# Patient Record
Sex: Female | Born: 1985 | Race: White | Hispanic: No | Marital: Married | Smoking: Never smoker
Health system: Southern US, Community
[De-identification: ages and names within clinical notes are randomized; demographics above are authoritative.]

## PROBLEM LIST (undated history)

## (undated) DIAGNOSIS — F988 Other specified behavioral and emotional disorders with onset usually occurring in childhood and adolescence: Secondary | ICD-10-CM

## (undated) HISTORY — PX: APPENDECTOMY: SHX54

## (undated) HISTORY — DX: Other specified behavioral and emotional disorders with onset usually occurring in childhood and adolescence: F98.8

---
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
## 2007-01-19 ENCOUNTER — Ambulatory Visit: Payer: Self-pay | Admitting: Internal Medicine

## 2007-03-10 ENCOUNTER — Ambulatory Visit: Payer: Self-pay | Admitting: Internal Medicine

## 2007-03-19 ENCOUNTER — Telehealth (INDEPENDENT_AMBULATORY_CARE_PROVIDER_SITE_OTHER): Payer: Self-pay | Admitting: *Deleted

## 2007-03-20 LAB — CONVERTED CEMR LAB
BUN: 5 mg/dL — ABNORMAL LOW (ref 6–23)
Basophils Absolute: 0 10*3/uL (ref 0.0–0.1)
Basophils Relative: 0.8 % (ref 0.0–1.0)
CO2: 28 meq/L (ref 19–32)
Calcium: 9.3 mg/dL (ref 8.4–10.5)
Chloride: 104 meq/L (ref 96–112)
Cholesterol: 164 mg/dL (ref 0–200)
Creatinine, Ser: 0.7 mg/dL (ref 0.4–1.2)
Eosinophils Absolute: 0.1 10*3/uL (ref 0.0–0.6)
Eosinophils Relative: 3.5 % (ref 0.0–5.0)
GFR calc Af Amer: 140 mL/min
GFR calc non Af Amer: 116 mL/min
Glucose, Bld: 84 mg/dL (ref 70–99)
HCT: 35.5 % — ABNORMAL LOW (ref 36.0–46.0)
HDL: 38.9 mg/dL — ABNORMAL LOW (ref >39.0)
Hemoglobin: 12.5 g/dL (ref 12.0–15.0)
LDL Cholesterol: 103 mg/dL — ABNORMAL HIGH (ref 0–99)
Lymphocytes Relative: 35.8 % (ref 12.0–46.0)
MCHC: 35.3 g/dL (ref 30.0–36.0)
MCV: 86.1 fL (ref 78.0–100.0)
Monocytes Absolute: 0.4 10*3/uL (ref 0.2–0.7)
Monocytes Relative: 9.7 % (ref 3.0–11.0)
Neutro Abs: 2.1 10*3/uL (ref 1.4–7.7)
Neutrophils Relative %: 50.2 % (ref 43.0–77.0)
Platelets: 252 10*3/uL (ref 150–400)
Potassium: 3.7 meq/L (ref 3.5–5.1)
RBC: 4.12 M/uL (ref 3.87–5.11)
RDW: 11.5 % (ref 11.5–14.6)
Sodium: 138 meq/L (ref 135–145)
TSH: 1.66 microintl units/mL (ref 0.35–5.50)
Total CHOL/HDL Ratio: 4.2
Triglycerides: 110 mg/dL (ref 0–149)
VLDL: 22 mg/dL (ref 0–40)
WBC: 4.1 10*3/uL — ABNORMAL LOW (ref 4.5–10.5)

## 2007-08-05 ENCOUNTER — Ambulatory Visit: Payer: Self-pay | Admitting: Internal Medicine

## 2007-08-05 DIAGNOSIS — F909 Attention-deficit hyperactivity disorder, unspecified type: Secondary | ICD-10-CM

## 2007-09-28 ENCOUNTER — Ambulatory Visit: Payer: Self-pay | Admitting: Internal Medicine

## 2007-09-29 ENCOUNTER — Telehealth: Payer: Self-pay | Admitting: Internal Medicine

## 2007-10-28 ENCOUNTER — Telehealth (INDEPENDENT_AMBULATORY_CARE_PROVIDER_SITE_OTHER): Payer: Self-pay | Admitting: *Deleted

## 2007-11-26 ENCOUNTER — Telehealth (INDEPENDENT_AMBULATORY_CARE_PROVIDER_SITE_OTHER): Payer: Self-pay | Admitting: *Deleted

## 2007-12-23 ENCOUNTER — Telehealth (INDEPENDENT_AMBULATORY_CARE_PROVIDER_SITE_OTHER): Payer: Self-pay | Admitting: *Deleted

## 2008-01-18 ENCOUNTER — Telehealth (INDEPENDENT_AMBULATORY_CARE_PROVIDER_SITE_OTHER): Payer: Self-pay | Admitting: *Deleted

## 2008-05-17 ENCOUNTER — Ambulatory Visit: Payer: Self-pay | Admitting: Internal Medicine

## 2008-05-17 DIAGNOSIS — J309 Allergic rhinitis, unspecified: Secondary | ICD-10-CM | POA: Insufficient documentation

## 2008-06-14 ENCOUNTER — Telehealth (INDEPENDENT_AMBULATORY_CARE_PROVIDER_SITE_OTHER): Payer: Self-pay | Admitting: *Deleted

## 2008-07-13 ENCOUNTER — Telehealth (INDEPENDENT_AMBULATORY_CARE_PROVIDER_SITE_OTHER): Payer: Self-pay | Admitting: *Deleted

## 2008-08-26 ENCOUNTER — Telehealth (INDEPENDENT_AMBULATORY_CARE_PROVIDER_SITE_OTHER): Payer: Self-pay | Admitting: *Deleted

## 2008-10-14 ENCOUNTER — Telehealth (INDEPENDENT_AMBULATORY_CARE_PROVIDER_SITE_OTHER): Payer: Self-pay | Admitting: *Deleted

## 2008-12-07 ENCOUNTER — Telehealth: Payer: Self-pay | Admitting: Internal Medicine

## 2008-12-13 ENCOUNTER — Telehealth (INDEPENDENT_AMBULATORY_CARE_PROVIDER_SITE_OTHER): Payer: Self-pay | Admitting: *Deleted

## 2009-05-15 ENCOUNTER — Telehealth (INDEPENDENT_AMBULATORY_CARE_PROVIDER_SITE_OTHER): Payer: Self-pay | Admitting: *Deleted

## 2009-05-16 ENCOUNTER — Ambulatory Visit: Payer: Self-pay | Admitting: Family Medicine

## 2009-11-13 ENCOUNTER — Telehealth (INDEPENDENT_AMBULATORY_CARE_PROVIDER_SITE_OTHER): Payer: Self-pay | Admitting: *Deleted

## 2009-11-13 ENCOUNTER — Ambulatory Visit: Payer: Self-pay | Admitting: Family

## 2009-11-13 LAB — CONVERTED CEMR LAB
BUN: 11 mg/dL (ref 6–23)
Basophils Absolute: 0 10*3/uL (ref 0.0–0.1)
Basophils Relative: 0.1 % (ref 0.0–3.0)
CO2: 29 meq/L (ref 19–32)
Calcium: 9 mg/dL (ref 8.4–10.5)
Chloride: 107 meq/L (ref 96–112)
Creatinine, Ser: 0.7 mg/dL (ref 0.4–1.2)
Eosinophils Absolute: 0.2 10*3/uL (ref 0.0–0.7)
Eosinophils Relative: 3.2 % (ref 0.0–5.0)
GFR calc non Af Amer: 111.72 mL/min (ref >60)
Glucose, Bld: 85 mg/dL (ref 70–99)
HCT: 37.6 % (ref 36.0–46.0)
Hemoglobin: 12.8 g/dL (ref 12.0–15.0)
Lymphocytes Relative: 33.4 % (ref 12.0–46.0)
Lymphs Abs: 2 10*3/uL (ref 0.7–4.0)
MCHC: 34.1 g/dL (ref 30.0–36.0)
MCV: 88.4 fL (ref 78.0–100.0)
Monocytes Absolute: 0.6 10*3/uL (ref 0.1–1.0)
Monocytes Relative: 9.9 % (ref 3.0–12.0)
Neutro Abs: 3.2 10*3/uL (ref 1.4–7.7)
Neutrophils Relative %: 53.4 % (ref 43.0–77.0)
Platelets: 304 10*3/uL (ref 150.0–400.0)
Potassium: 3.6 meq/L (ref 3.5–5.1)
RBC: 4.25 M/uL (ref 3.87–5.11)
RDW: 11.8 % (ref 11.5–14.6)
Sodium: 140 meq/L (ref 135–145)
WBC: 6 10*3/uL (ref 4.5–10.5)

## 2009-11-14 ENCOUNTER — Encounter: Payer: Self-pay | Admitting: Family

## 2010-02-23 ENCOUNTER — Telehealth (INDEPENDENT_AMBULATORY_CARE_PROVIDER_SITE_OTHER): Payer: Self-pay | Admitting: *Deleted

## 2010-05-07 ENCOUNTER — Ambulatory Visit: Payer: Self-pay | Admitting: Internal Medicine

## 2010-05-10 ENCOUNTER — Telehealth: Payer: Self-pay | Admitting: Internal Medicine

## 2010-05-10 ENCOUNTER — Ambulatory Visit: Payer: Self-pay | Admitting: Internal Medicine

## 2010-07-18 ENCOUNTER — Telehealth (INDEPENDENT_AMBULATORY_CARE_PROVIDER_SITE_OTHER): Payer: Self-pay | Admitting: *Deleted

## 2010-10-09 ENCOUNTER — Ambulatory Visit: Admit: 2010-10-09 | Payer: Self-pay | Admitting: Internal Medicine

## 2010-10-10 ENCOUNTER — Telehealth: Payer: Self-pay | Admitting: Internal Medicine

## 2010-10-15 ENCOUNTER — Ambulatory Visit
Admission: RE | Admit: 2010-10-15 | Discharge: 2010-10-15 | Payer: Self-pay | Source: Home / Self Care | Attending: Internal Medicine | Admitting: Internal Medicine

## 2010-10-30 NOTE — Progress Notes (Signed)
Summary: Refill Request/appt   Phone Note Refill Request Call back at Work Phone [phone] Message from:  Pharmacy on July 18, 2010 3:14 PM  Refills Requested: Medication #1:  ADDERALL 10 MG  TABS 2 by mouth in AM and 1 by mouth at PM PRN   Dosage confirmed as above?Dosage Confirmed   Supply Requested: 1 month Please call patient's mom when ready to pick up @ [phone]   Method Requested: Pick up at Office Next Appointment Scheduled: none Initial call taken by: Harold Barban,  July 18, 2010 3:14 PM  Follow-up for Phone Call        has been seen for acutes but not for follow up on meds, okay to fill?  Follow-up by: Army Fossa CMA,  July 18, 2010 3:30 PM  Additional Follow-up for Phone Call Additional follow up Details #1::        ok #90, no RF schedule a visit within the next 3 months please Additional Follow-up by: [facility] E. Paz MD,  July 18, 2010 6:10 PM    Additional Follow-up for Phone Call Additional follow up Details #2::    left message rx was ready. Pt needs appt in 3 months.  Follow-up by: Army Fossa CMA,  July 19, 2010 8:11 AM  Additional Follow-up for Phone Call Additional follow up Details #3:: Details for Additional Follow-up Action Taken: [email or web address].Harold Barban  July 19, 2010 8:43 AM  Patient has an appt 1.10.12.Harold Barban  July 20, 2010 8:46 AM  Prescriptions: ADDERALL 10 MG  TABS (AMPHETAMINE-DEXTROAMPHETAMINE) 2 by mouth in AM and 1 by mouth at PM PRN  #90 x 0   Entered by:   Army Fossa CMA   Authorized by:   Nolon Rod. Paz MD   Signed by:   Army Fossa CMA on 07/19/2010   Method used:   Print then Give to Patient   RxID:   [id number]

## 2010-10-30 NOTE — Assessment & Plan Note (Signed)
Summary: spider bite/cbs   Vital Signs:  Patient profile:   25 year old female Weight:      133.13 pounds Pulse rate:   92 / minute Pulse rhythm:   regular BP sitting:   126 / 82  (left arm) Cuff size:   regular  Vitals Entered By: Army Fossa CMA (May 07, 2010 1:08 PM) CC: Bug bite upper leg Comments - has been there for 2-3 days.  Very red, swollen.    History of Present Illness: started with what she thought it was a bug bite at the right buttock 2 days ago The area is now getting slightly bigger, she has applied some heat  ROS She saw some discharge 2 days ago  no blisters  No fevers   Current Medications (verified): 1)  Clarinex 5 Mg Tabs (Desloratadine) .Marland Kitchen.. 1 By Mouth Once Daily 2)  Adderall 10 Mg  Tabs (Amphetamine-Dextroamphetamine) .... 2 By Mouth in Am and 1 By Mouth At Pm 3)  Trinessa (28) 0.035 Mg Tabs (Norgestimate-Ethinyl Estradiol) .... Use As Directed 4)  Minocycline Hcl 100 Mg Caps (Minocycline Hcl) .Marland Kitchen.. 1 By Mouth Two Times A Day  Allergies (verified): No Known Drug Allergies  Past History:  Past Medical History: Reviewed history from 05/17/2008 and no changes required. ADD Allergic rhinitis  Past Surgical History: Reviewed history from 03/10/2007 and no changes required. Appendectomy  Social History: Single graduated from college at [employer]. 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   Skin:  at the distal right buttock she has a 2x1centimeter  area of induration , slight warmness and tenderness. No fluctuancy or discharge noted   Impression & Recommendations:  Problem # 1:  CELLULITIS, BUTTOCKS (ICD-682.5) small area of cellulitis She is on Minocycline  routinely Prescribe Bactrim Avoid excessive sun exposure Will call if no better in few  days Her updated medication list for this problem includes:    Minocycline Hcl 100 Mg  Caps (Minocycline hcl) .Marland Kitchen... 1 by mouth two times a day    Bactrim Ds 800-160 Mg Tabs (Sulfamethoxazole-trimethoprim) .Marland Kitchen... 1 by mouth two times a day  Complete Medication List: 1)  Adderall 10 Mg Tabs (Amphetamine-dextroamphetamine) .... 2 by mouth in am and 1 by mouth at pm prn 2)  Trinessa (28) 0.035 Mg Tabs (Norgestimate-ethinyl estradiol) .... Use as directed 3)  Minocycline Hcl 100 Mg Caps (Minocycline hcl) .Marland Kitchen.. 1 by mouth two times a day 4)  Bactrim Ds 800-160 Mg Tabs (Sulfamethoxazole-trimethoprim) .Marland Kitchen.. 1 by mouth two times a day Prescriptions: BACTRIM DS 800-160 MG TABS (SULFAMETHOXAZOLE-TRIMETHOPRIM) 1 by mouth two times a day  #14 x 0   Entered and Authorized by:   Nolon Rod. Paz MD   Signed by:   Nolon Rod. Paz MD on 05/07/2010   Method used:   Print then Give to Patient   RxID:   [id number]

## 2010-10-30 NOTE — Progress Notes (Signed)
Summary: ADDERRAL REFILL  Phone Note Call from Patient Call back at Work Phone [phone]   Caller: Patient Summary of Call: PATIENT FORGOT TO ASK FOR ADDERRAL PRESCRIPTION AT OFFICE VISIT---  PLEASE CALL MOM = BARBARA AT [phone] WHEN READY FOR PICKUP Initial call taken by: Jerolyn Shin,  November 13, 2009 11:30 AM  Follow-up for Phone Call        spoke w/ mom aware prescription ready for pick up....Marland KitchenMarland KitchenDoristine Devoid  November 13, 2009 12:01 PM     Prescriptions: ADDERALL 10 MG  TABS (AMPHETAMINE-DEXTROAMPHETAMINE) 2 by mouth in AM and 1 by mouth at PM  #90 x 0   Entered by:   Doristine Devoid   Authorized by:   Nolon Rod. Paz MD   Signed by:   Doristine Devoid on 11/13/2009   Method used:   Print then Give to Patient   RxID:   [id number]

## 2010-10-30 NOTE — Letter (Signed)
   [facility] [address] [place], [place] [address] [phone]    November 14, 2009   [facility] [address] [place], [place] [address]  RE:  LAB RESULTS  Dear  Ms. Self,  The following is an interpretation of your most recent lab tests.  Please take note of any instructions provided or changes to medications that have resulted from your lab work.  ELECTROLYTES:  Good - no changes needed  KIDNEY FUNCTION TESTS:  Good - no changes needed    CBC:  Good - no changes needed   Sincerely Yours,    Lemont Fillers FNP

## 2010-10-30 NOTE — Progress Notes (Signed)
Summary: info before appt  Phone Note Call from Patient   Caller: Mom Summary of Call: PATIENT MOM CALLED SAID PATIENT HAS APPT TODAY -  she said rash looks likeiimpetigo - she would like her to get a shot for itch & lab   Follow-up for Phone Call        Noted. Army Fossa CMA  May 10, 2010 9:16 AM

## 2010-10-30 NOTE — Assessment & Plan Note (Signed)
Summary: LFT LEG BITE BETTER;NOW HAS OTHER SPOTS ON SAME LEG (IMPETIGO...   Vital Signs:  Patient profile:   25 year old female Weight:      133.50 pounds Pulse rate:   79 / minute Pulse rhythm:   regular BP sitting:   112 / 82  (left arm) Cuff size:   regular  Vitals Entered By: Army Fossa CMA (May 10, 2010 10:13 AM) CC: Pt here for possible Impetigo? Comments Initial spot on upper leg cleared up. New spot has appeared, very red, itching, has bumps around it.  Mom sent a note asking that we do bloodwork, give her something for the itch, and if an injection is possible to give her one.    History of Present Illness: as above original site in the buttock  seems better She has developed a rash in the right leg, posterior aspect, extremely pruritic.  ROS Taking antibiotics as prescribed No fever  , HA, does not feelbad  She reports that last week she was outdoors, taking a trip by the [facility] she  also does Yoga. Does not use hot-tubs  Current Medications (verified): 1)  Adderall 10 Mg  Tabs (Amphetamine-Dextroamphetamine) .... 2 By Mouth in Am and 1 By Mouth At Pm Prn 2)  Trinessa (28) 0.035 Mg Tabs (Norgestimate-Ethinyl Estradiol) .... Use As Directed 3)  Minocycline Hcl 100 Mg Caps (Minocycline Hcl) .Marland Kitchen.. 1 By Mouth Two Times A Day 4)  Bactrim Ds 800-160 Mg Tabs (Sulfamethoxazole-Trimethoprim) .Marland Kitchen.. 1 By Mouth Two Times A Day  Allergies (verified): No Known Drug Allergies  Past History:  Past Medical History: Reviewed history from 05/17/2008 and no changes required. ADD Allergic rhinitis  Past Surgical History: Reviewed history from 03/10/2007 and no changes required. Appendectomy  Physical Exam  General:  alert, well-developed, and well-nourished.   Extremities:  no lower extremity edema Skin:  the previously seen rash in the right buttock seems less red, warm. No induration or fluctuation at the posterior aspect of the right leg she has several skin  lesions:papular, some red, some in a linear distribution   Impression & Recommendations:  Problem # 1:  CELLULITIS, BUTTOCKS (ICD-682.5) improving Her updated medication list for this problem includes:    Minocycline Hcl 100 Mg Caps (Minocycline hcl) .Marland Kitchen... 1 by mouth two times a day    Bactrim Ds 800-160 Mg Tabs (Sulfamethoxazole-trimethoprim) .Marland Kitchen... 1 by mouth two times a day  Problem # 2:  CONTACT DERMATITIS (ICD-692.9) based on the history of being outdoors, the aspect of the lesion and severe pruritus, I think she has   contact dermatitis Plan: Steroids Hydrocortisone. I don't think is necessary to do blood work wash all her clothing  Her updated medication list for this problem includes:    Prednisone 10 Mg Tabs (Prednisone) .Marland KitchenMarland KitchenMarland KitchenMarland Kitchen 4 by mouth once daily x 2. 3x2, 2x2,1x2    Hydrocortisone 2.5 % Crea (Hydrocortisone) .Marland Kitchen... Apply twice a day for one week  Complete Medication List: 1)  Adderall 10 Mg Tabs (Amphetamine-dextroamphetamine) .... 2 by mouth in am and 1 by mouth at pm prn 2)  Trinessa (28) 0.035 Mg Tabs (Norgestimate-ethinyl estradiol) .... Use as directed 3)  Minocycline Hcl 100 Mg Caps (Minocycline hcl) .Marland Kitchen.. 1 by mouth two times a day 4)  Bactrim Ds 800-160 Mg Tabs (Sulfamethoxazole-trimethoprim) .Marland Kitchen.. 1 by mouth two times a day 5)  Prednisone 10 Mg Tabs (Prednisone) .... 4 by mouth once daily x 2. 3x2, 2x2,1x2 6)  Hydrocortisone 2.5 % Crea (Hydrocortisone) .Marland KitchenMarland KitchenMarland Kitchen  Apply twice a day for one week Prescriptions: HYDROCORTISONE 2.5 % CREA (HYDROCORTISONE) apply twice a day for one week  #1 x 1   Entered and Authorized by:   Elita Quick E. Paz MD   Signed by:   Nolon Rod. Paz MD on 05/10/2010   Method used:   Print then Give to Patient   RxID:   [id number] PREDNISONE 10 MG TABS (PREDNISONE) 4 by mouth once daily x 2. 3x2, 2x2,1x2  #20 x 0   Entered and Authorized by:   Nolon Rod. Paz MD   Signed by:   Nolon Rod. Paz MD on 05/10/2010   Method used:   Print then Give to Patient    RxID:   [id number]

## 2010-10-30 NOTE — Assessment & Plan Note (Signed)
Summary: VOMITING,WEAK,DIZZY/RH........Marland Kitchen   Vital Signs:  Patient profile:   25 year old female Weight:      131 pounds Temp:     97.7 degrees F oral BP sitting:   112 / 66  (left arm)  Vitals Entered By: Doristine Devoid (November 13, 2009 10:43 AM) CC: NVD started on fri. some weakness and dizziness better today    CC:  NVD started on fri. some weakness and dizziness better today .  History of Present Illness: [name] is a 25 year old female who presents today with c/o nausea and vomitting which started 2 nights ago.  This was accompanied by stabing abdominal pains.  Yesterday started to feel dizzy and again developed stomach pains, had diarrhea but no vomitting starting last night at 11PM.  She felt feverish/chills, but did not take temperature.  She was afraid that she may have TSS, because she often leaves tampons in for 12 hours.  Right now she feel better,  no diarrhea this AM.  She has not yet had anything to eat or drink today.  Allergies: No Known Drug Allergies  Physical Exam  General:  Well-developed,well-nourished,in no acute distress; alert,appropriate and cooperative throughout examination Head:  Normocephalic and atraumatic without obvious abnormalities. No apparent alopecia or balding. Lungs:  Normal respiratory effort, chest expands symmetrically. Lungs are clear to auscultation, no crackles or wheezes. Heart:  Normal rate and regular rhythm. S1 and S2 normal without gallop, murmur, click, rub or other extra sounds. Abdomen:  Bowel sounds positive,abdomen soft and non-tender without masses, organomegaly or hernias noted. No sign of acute abdomen   Impression & Recommendations:  Problem # 1:  GASTROENTERITIS, ACUTE (ICD-558.9) Assessment New Will get BMP to assess hydration as well as CBC to r/o significant leukocytosis.  Add zofran as needed nausea, suspect mild dehydration.  Patient encouraged to buy gatorade and take small frequent sips, advance at tolerated to a  regular diet.  Patient instructed to call if fever over 101, worsening abdominal pain or if she is unable to keep down food, drink or medicine.  Orders: Venipuncture (40102) TLB-CBC Platelet - w/Differential (85025-CBCD) TLB-BMP (Basic Metabolic Panel-BMET) (80048-METABOL)  Her updated medication list for this problem includes:    Zofran 4 Mg/75ml Soln (Ondansetron hcl) ..... One tablet by mouth every 8 hours as needed for nausea  Complete Medication List: 1)  Clarinex 5 Mg Tabs (Desloratadine) .Marland Kitchen.. 1 by mouth once daily 2)  Adderall 10 Mg Tabs (Amphetamine-dextroamphetamine) .... 2 by mouth in am and 1 by mouth at pm 3)  Trinessa (28) 0.035 Mg Tabs (Norgestimate-ethinyl estradiol) .... Use as directed 4)  Zofran 4 Mg/36ml Soln (Ondansetron hcl) .... One tablet by mouth every 8 hours as needed for nausea  Patient Instructions: 1)  Drink clear liquids only for the next 24 hours, then slowly add other liquids and food as you  tolerate them. 2)  Call if you are unable to keep down liquid/food or medicine, if you develop worsening abdominal pain or if you develop fever over 101. Prescriptions: ZOFRAN 4 MG/5ML SOLN (ONDANSETRON HCL) one tablet by mouth every 8 hours as needed for nausea  #20 x 0   Entered and Authorized by:   Lemont Fillers FNP   Signed by:   Lemont Fillers FNP on 11/13/2009   Method used:   Electronically to        [facility] (retail)       [address]  [place], [place]  [address]       Ph: [phone]       Fax: [phone]   RxID:   [id number]

## 2010-10-30 NOTE — Progress Notes (Signed)
Summary: refill  Phone Note Refill Request Call back at Work Phone [phone] Call back at mom cell Message from:  Patient on Feb 23, 2010 11:13 AM  Refills Requested: Medication #1:  ADDERALL 10 MG  TABS 2 by mouth in AM and 1 by mouth at PM patient mom will pick up     Prescriptions: ADDERALL 10 MG  TABS (AMPHETAMINE-DEXTROAMPHETAMINE) 2 by mouth in AM and 1 by mouth at PM  #90 x 0   Entered by:   Shary Decamp   Authorized by:   Nolon Rod. Paz MD   Signed by:   Shary Decamp on 02/23/2010   Method used:   Print then Give to Patient   RxID:   [id number]

## 2010-11-01 NOTE — Assessment & Plan Note (Signed)
Summary: 3 MONTH OV//PH   Vital Signs:  Patient profile:   25 year old female Weight:      131.13 pounds Pulse rate:   76 / minute Pulse rhythm:   regular BP sitting:   112 / 84  (left arm) Cuff size:   regular  Vitals Entered By: Army Fossa CMA (October 15, 2010 2:49 PM) CC: 3 month f/u- fasting  Comments no complaints [facility] [place]    History of Present Illness: ROV doing well Needs a refill on adderall, she takes it p.r.n. only. Currently working and trying to apply to law school. The need for adderall has decreased because she  is not in  college  ROS Recently he took Xanax as prescribed for an airplane trip. It worked well Denies anxiety or depression Allergies well controlled She just saw her gynecologist, taking birth control pills without problems  Current Medications (verified): 1)  Adderall 10 Mg  Tabs (Amphetamine-Dextroamphetamine) .... 2 By Mouth in Am and 1 By Mouth At Pm Prn 2)  Trinessa (28) 0.035 Mg Tabs (Norgestimate-Ethinyl Estradiol) .... Use As Directed  Allergies (verified): No Known Drug Allergies  Past History:  Past Medical History: Reviewed history from 05/17/2008 and no changes required. ADD Allergic rhinitis  Social History: Reviewed history from 05/07/2010 and no changes required. Single graduated from college at [employer]. 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   Lungs:  Normal respiratory effort, chest expands symmetrically. Lungs are clear to auscultation, no crackles or wheezes. Heart:  Normal rate and regular rhythm. S1 and S2 normal without gallop, murmur, click, rub or other extra sounds. Psych:  not anxious appearing and not depressed appearing.     Impression & Recommendations:  Problem # 1:  ADHD (ICD-314.01) symptoms well controlled. Review Came back in 6 months Call when needed for refills  Complete  Medication List: 1)  Adderall 10 Mg Tabs (Amphetamine-dextroamphetamine) .... 2 by mouth in am and 1 by mouth at pm prn 2)  Trinessa (28) 0.035 Mg Tabs (Norgestimate-ethinyl estradiol) .... Use as directed  Patient Instructions: 1)  Please schedule a follow-up appointment in 6 months .  Prescriptions: ADDERALL 10 MG  TABS (AMPHETAMINE-DEXTROAMPHETAMINE) 2 by mouth in AM and 1 by mouth at PM PRN  #90 x 0   Entered and Authorized by:   Nolon Rod. Yuri Fana MD   Signed by:   Nolon Rod. Satrina Magallanes MD on 10/15/2010   Method used:   Print then Give to Patient   RxID:   [id number]    Orders Added: 1)  Est. Patient Level III [62952]

## 2010-11-01 NOTE — Progress Notes (Signed)
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 Taneasha Fuqua MD,  October 10, 2010 1:08 PM  Additional Follow-up for Phone Call Additional follow up Details #1::        Patient notified.  Additional Follow-up by: Lucious Groves CMA,  October 10, 2010 3:34 PM    New/Updated Medications: ALPRAZOLAM 0.5 MG TABS (ALPRAZOLAM) one by mouth twice a day as needed Prescriptions: ALPRAZOLAM 0.5 MG TABS (ALPRAZOLAM) one by mouth twice a day as needed  #10 x 0   Entered by:   Lucious Groves CMA   Authorized by:   Nolon Rod. Dalayah Deahl MD   Signed by:   Lucious Groves CMA on 10/10/2010   Method used:   Printed then faxed to ...         RxID:   [id number]

## 2010-11-19 ENCOUNTER — Telehealth: Payer: Self-pay | Admitting: Internal Medicine

## 2010-11-27 NOTE — Progress Notes (Signed)
Summary: xanax for flight  Phone Note Call from Patient Call back at Work Phone [phone]   Caller: Mom Reason for Call: Insurance Question Summary of Call: Pts mother called and states that Zamya is flying at the end of this week. Would like Xanax. Please advise. Army Fossa CMA  November 19, 2010 1:49 PM   Follow-up for Phone Call        ok xanax 0.5mg  1 or 2 by mouth before flying, no more than 2/day;   #12, no RF Follow-up by: Nolon Rod. Sebert Stollings MD,  November 19, 2010 4:49 PM  Additional Follow-up for Phone Call Additional follow up Details #1::        Left message for pts mother that rx was ready for pick up per her request. Army Fossa CMA  November 19, 2010 4:50 PM     New/Updated Medications: XANAX 0.5 MG TABS (ALPRAZOLAM) 1 or 2 by mouth before flying, no more than 2/day Prescriptions: XANAX 0.5 MG TABS (ALPRAZOLAM) 1 or 2 by mouth before flying, no more than 2/day  #12 x 0   Entered by:   Army Fossa CMA   Authorized by:   Nolon Rod. Sorcha Rotunno MD   Signed by:   Army Fossa CMA on 11/19/2010   Method used:   Print then Give to Patient   RxID:   [id number]

## 2011-01-08 ENCOUNTER — Telehealth: Payer: Self-pay | Admitting: Internal Medicine

## 2011-01-08 MED ORDER — ALPRAZOLAM 0.5 MG PO TABS: ORAL_TABLET | ORAL | Status: DC

## 2011-01-08 NOTE — Telephone Encounter (Signed)
Last filled 11/19/10 #12, no refills.

## 2011-01-08 NOTE — Telephone Encounter (Signed)
Patient is flying out of town on Thursday and needs prescription for Xanax for a round trip of flying--please call it in to [place] , [employer], [place]---needs to pickup before Thursday

## 2011-01-08 NOTE — Telephone Encounter (Signed)
Ok for #12, no refills 

## 2011-01-25 ENCOUNTER — Other Ambulatory Visit: Payer: Self-pay | Admitting: *Deleted

## 2011-01-25 MED ORDER — AMPHETAMINE-DEXTROAMPHETAMINE 10 MG PO TABS: ORAL_TABLET | ORAL | Status: DC

## 2011-01-25 NOTE — Telephone Encounter (Signed)
Left detailed message that rx would be have to picked up and it was ready.

## 2011-02-15 NOTE — Op Note (Signed)
[name], [name]                            ACCOUNT NO.:  [id number]   MEDICAL RECORD NO.:  [id number]                   PATIENT TYPE:  INP   LOCATION:  6126                                 FACILITY:  [facility]   PHYSICIAN:  Prabhakar D. Pendse, M.D.           DATE OF BIRTH:  September 13, 1986   DATE OF PROCEDURE:  10/05/2003  DATE OF DISCHARGE:                                 OPERATIVE REPORT   PREOPERATIVE DIAGNOSIS:  Acute appendicitis with perforation and extensive  peritoneal fluid collection.   POSTOPERATIVE DIAGNOSIS:  Acute appendicitis with perforation and extensive  peritoneal fluid collection.   OPERATION PERFORMED:  1. Attempted laparoscopy, unsuccessful.  2. Exploratory laparotomy and appendectomy.  3. Placement of pelvic peritoneal drain.   SURGEON:  Prabhakar D. Levie Heritage, M.D.   ASSISTANT:  Leonia Corona, M.D.   ANESTHESIA:  Nurse.   OPERATIVE INDICATIONS:  This 25 year old girl was admitted on October 03, 2003, with the history of initially vague but persistent lower abdominal  pain, progressive lower abdominal swelling, anorexia, and low grade fever of  about 10-12 days duration.  The patient's illness started on December 23.  After two days of abdominal pain, she was seen at Urgent Care Facility  because of the lower abdominal pressure sensation and urinalysis showing  WBCs and RBCs, she was placed on Sulfatrim.  She developed diarrhea, the  abdominal pain persisted, and she appeared more and more ill looking.  She  was seen by Dr. Drue Novel at [facility] on October 03, 2003, and referred.  When seen in the emergency room , she had diffuse lower abdominal tenderness  with guarding.  A CBC showed white count 32,000 with shift to the left.  CT  scan of the abdomen and pelvis was done which showed extensive free fluid  around the liver, mid abdomen, and pelvis, at places loculated, with the  presumed diagnosis of possible ruptured appendix.  The patient was  admitted  for IV fluid therapy as well as IV antibiotics, ampicillin, gentamicin, and  clindamycin.  She underwent placement of PICC line on January 4 as well as  drainage of abdominal fluid which was serous in nature and on gram stain  showed only a few WBCs without any organisms.  At this time, diagnosis of  ruptured appendicitis and the patient was scheduled for surgery.   OPERATIVE FINDINGS:  Attempted laparoscopy revealed serous and cloudy fluid  in the mid abdomen with multiple adhesions between the parietal peritoneum  and what appeared to be thickened omentum.  No definite omental cysts were  identified and further exploration by laparoscopic means seemed impossible  on account of adhesions.  Hence, open laparotomy was performed.  At  laparotomy, there were extensive adhesions between the thickened omentum and  the parietal peritoneum almost throughout the anterior abdominal wall.  There was a large collection of fluid around the liver, spleen, lesser sac,  and in  the pelvic area.  Most of the fluid was light straw colored and  sanguineous in account of dissection, in the right lower quadrant area,  however, there was a pocket of pus with chronic abscess suggestive of  ruptured appendicitis.  The appendix, itself, was bulbous and thickened and  erythematous with indurated mesentery, no gross perforation could be  identified.  The uterus, ovaries, and fallopian tubes were all indurated due  to secondary infection, edema, and induration.  No other significant  pathology was identified.   OPERATIVE PROCEDURE:  Under satisfactory general endotracheal anesthesia,  the patient in supine position, the abdomen was sterilely prepped and draped  in the usual manner.  An infraumbilical incision was made and by careful  manipulation, Hasson port was placed and it was insufflated and attempts  were made to visualize the peritoneal cavity through the scope.  What was  seen was a moderate  amount of serous and straw colored fluid inbetween soft  adhesions between the omentum and parietal peritoneum. One could not get a  clear cut exposure of the bowel, liver, or general peritoneal cavity on  account of adhesions and loculations.  Hence, further attempts to continue  laparoscopic exploration was deferred and laparotomy planned.   At this time, all the laparoscopic equipment was removed.  The patient was  repaired for laparotomy.  A midline infraumbilical vertical incision was  made.  It was carried through the layers of the abdominal wall.  The  peritoneal cavity was entered with findings as described above.  Initially,  all the mid and upper abdominal fluid was aspirated, the abdominal cavity  was copiously irrigated with saline.  There was moderate amount of oozing,  no active bleeding could be identified.  Interestingly, there was no odor to  this abdominal fluid.  Once the dissection was carried out in the pelvic  area, an abscessed cavity was entered, still there was no odor, a small  quantity of pus was obtained which was collected for bacteriologic  examination.  The structures and tissues, the appendix, cecum, showed marked  serositis due to ruptured appendix and abscess, no obvious terminal ileitis  could be seen.  The appendix was bulbous and measured about 3 inches wall  with marked induration, no gross perforation was seen.  The appendiceal  mesentery was serially clamped, cut, and ligated with 2-0 Vicryl.  Appendectomy was done in routine fashion.  The stump was not buried,  instead, it was suture ligated with 2-0 Vicryl and two large hemoclips were  placed.  The area appeared quite friable, hence, burying of the appendicular  stump was deferred.   The pelvic cavity was irrigated.  There was a moderate amount of oozing  noted.  The active bleeders were clamped, cut, and ligated.  A #15 mm Jackson-Pratt drain was placed in the pelvic cavity and brought out  through  the left mid quadrant area through the area where the radiologist had put a  drain in the peritoneal cavity.  After this, the abdominal cavity was  copiously irrigated with saline.  Sponge and needle counts being correct,  the abdomen was closed with #1 PDS running interlocking sutures, the skin  was loosely approximated with staples.  Montgomery strap dressing applied.  Throughout the procedure, the patient's vital signs remained stable.  The  patient withstood the procedure well and was transferred to the recovery  room in satisfactory general condition.  Prabhakar D. Levie Heritage, M.D.    PDP/MEDQ  D:  10/05/2003  T:  10/05/2003  Job:  [id number]   cc:   [place], M.D.  [address], [address]  [place]  [place] [address]  Fax: [phone]   Wanda Plump, MD [facility]  [phone] [place], [place] [address]

## 2011-02-15 NOTE — Discharge Summary (Signed)
Mercer, Evelyn                            ACCOUNT NO.:  [id number]   MEDICAL RECORD NO.:  [id number]                   PATIENT TYPE:  INP   LOCATION:  6126                                 FACILITY:  [facility]   PHYSICIAN:  Prabhakar D. Pendse, M.D.           DATE OF BIRTH:  [birth year]   DATE OF ADMISSION:  10/03/2003  DATE OF DISCHARGE:  10/14/2003                                 DISCHARGE SUMMARY   DISCHARGE DIAGNOSIS:  Acute appendicitis with perforation and extensive  peritoneal fluid collection.   DISCHARGE MEDICATIONS:  1. Tylenol No.3 elixir 15 to 20 mL by mouth every four hours p.r.n. pain.  2. Zosyn 3.375 grams IV q.8h. to be administered by home health agency.   FOLLOW UP:  Follow-up appointment was scheduled with Dr. Levie Heritage in his  outpatient clinic on Tuesday following discharge at which time PICC line  will be removed as well as stable removal.   PROCEDURE:  Exploratory laparotomy and appendectomy, and placement of pelvic  peritoneal drain on October 05, 2003.   HISTORY OF PRESENT ILLNESS:  Briefly, the patient is a 25 year old girl who  was admitted on January 4, with a history of vague, but persistent lower  abdominal pain and progressive lower abdominal swelling.  Over the weeks  prior to admission, she had noted anorexia and low grade fevers.  She had  been seen by Urgent Care Facility because of this lower abdominal pressure  and was then placed on antibiotics as treatment.  On the day prior to  admission, she had been referred for the possibility of an acute abdomen.   PAST MEDICAL HISTORY:  Not significant.   PHYSICAL EXAMINATION:  GENERAL:  She was pale and weak-looking and in  apparent pain.  HEENT:  Normocephalic and atraumatic.  Pupils equal, round, and reactive to  light.  Extraocular muscles intact bilaterally.  Nares patent without  rhinorrhea.  Oropharynx clear without erythema or exudate.  TM's pearly  bilaterally.  NECK:  Supple without cervical  anterior, posterior lymphadenopathy.  CHEST:  Lungs clear to auscultation bilaterally.  No wheezes and no  crackles.  ABDOMEN:  There was some fullness of the lower abdomen appreciated and  generalized tenderness.  Marked decrease in bowel sounds and guarding  throughout with no definite masses appreciated on examination.  GENITOURINARY:  Tanner stage IV, normal GU examination.   LABORATORY DATA:  Hemoglobin of 12.5, hematocrit 35%, white blood cell count  32,000 with 96% neutrophils, and platelets of 388.  Urinalysis showed  negative leukocyte esterase, negative nitrite, 0 to 2 white blood cells, and  rare bacteria.   Abdominal x-ray showed nonspecific findings.   CT scan showed free fluid around the liver, mid abdomen, and pelvis, as well  as places in the lower abdomen.  The CT scan showed possible ruptured  appendix with perforation.  Based on these findings and history, it appeared  that the patient  is a 25 year old with acute abdomen and extensive  peritonitis as well as possible appendicitis status post perforation.  She  was admitted and started on 24 hours of antibiotics prior to her planned  surgery.   HOSPITAL COURSE:  Problem 1.  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  The patient underwent an exploratory  laparotomy on October 05, 2003, which revealed the findings of acute and  chronic appendicitis, status post perforation and extensive peritonitis  following chronic abscess formation.  She tolerated the surgery well and was  admitted for pain control during her healing process.  Postoperatively, she  did experience persistent pain which was managed with a morphine PCA and  subsequently with Tylenol No.3.  Throughout her hospital course, her  abdominal incision remained clean without evidence of superficial  superinfection.  There was never any drainage and it was kept clean and dry  with dressing changes frequently.  Several days into her  hospitalization,  the patient began to have return of some bowel function including passing  flatus and a bowel movement.  Throughout her hospitalization, she was  followed by surgery and at the time of discharge, she was comfortable and  able to ambulate without problems.  Follow up was arranged with Dr. Levie Heritage  for approximately one week after discharge.   Problem 2.  Infectious disease.  Given the extensive peritonitis found  during her laparotomy, the patient was continued on ampicillin, gentamicin,  and Clindamycin during her hospitalization.  She did run some low grade  fevers following her surgery, but had been afebrile for many days prior to  discharge.  The plan for her was to go home on IV antibiotics for persistent  anaerobic coverage.  She was discharged home on Zosyn dosed every eight  hours which would be arranged by Home Health.  The family was provided with  wound care instructions including frequent dressing changes, keeping the  site warm, clean, and dry, and using clean sterile gauze.   Problem 3.  FEN.  Throughout her hospitalization, the patient had a very  decreased appetite and an extended postoperative course before the return of  bowel function. Therefore, she was started on TPN on hospital day #3.  She  received parenteral nutrition for approximately four days.  Prior to  discharge, the patient had advanced to a normal diet and had good p.o. and  normal bowel movement.   Problem 4.  Pulmonary.  Postoperative day #1, the patient developed any  oxygen requirement.  Chest x-ray was obtained which showed evidence of a  large left pleural effusion.  This was thought to most likely be a  sympathetic effusion secondary to her chronic peritonitis.  She did receive  supplemental oxygen for approximately one day's time and was weaned quickly  to room air without difficulty.  Throughout the remainder of her hospitalization, we encouraged her to use incentive spirometry  when she was  bed bound and to ambulate frequently once she was able to be out of bed.  At  the time of discharge, she had been off of any supplemental oxygen and had  demonstrated no evidence of respiratory distress for an extended time.   Problem 5.  Anemia.  Prior to surgery, the patient's hemoglobin was 12.1.  Immediate postoperative value was 10.1 for her hemoglobin and 28.4 for her  hematocrit.  Additionally, we followed serial CBC's which showed evidence of  anemia with a nadir in her hemoglobin to 8.4.  Prior  to discharge, her  hemoglobin and hematocrit had been shown to be increased.  Her final  hemoglobin prior to discharge was 9.9 and hematocrit of 28.4.   Problem 6.  Discharge.  The family felt comfortable at the time of discharge  taking the patient home.  She was able to ambulate and a physical therapy  consultation was obtained in order to help her with climbing stairs.      Elmarie Shiley, M.D.                        Prabhakar D. Levie Heritage, M.D.    Hadley Pen  D:  10/27/2003  T:  10/28/2003  Job:  [id number]

## 2011-02-15 NOTE — Assessment & Plan Note (Signed)
[facility]                                 ON-CALL NOTE   NAME:[name], [name]                         MRN:          [id number]  DATE:01/04/2007                            DOB:          [birth year]    TELEPHONE NUMBER:  [phone]   SUBJECTIVE:  This is an 25 year old female who fell last night down some  concrete stairs and hit the back of her head. She was seen at the  emergency room last night and had a normal CAT scan done. She had a  laceration on the posterior part of her head and received four staples.  The area is not bleeding, but she has had some minor swelling over the  area and pain. She has been taking Tylenol, two tablets, every three  hours and is not having any relief of her head pain. She called the  emergency room where she was seen and they stated that for any other  pain medication she should would have to be seen again,   ASSESSMENT AND PLAN:  Head pain; laceration; no mental status changes: A  temporary prescription for Tylenol 3, 1-2 tablets, p.o. every 4 hours  p.r.n. pain was called to [facility] in [place], 15, 0 refills. The  patient was instructed that if she has any mental status changes,  confusion, increase in the pain, or if the pain is not well controlled  or improving by Monday, for her to call her primary care doctor.     Kerby Nora, MD  Electronically Signed    AB/MedQ  DD: 01/04/2007  DT: 01/05/2007  Job #: [id number]

## 2011-03-15 ENCOUNTER — Telehealth: Payer: Self-pay | Admitting: Internal Medicine

## 2011-03-15 ENCOUNTER — Encounter: Payer: Self-pay | Admitting: Internal Medicine

## 2011-03-15 MED ORDER — AMPHETAMINE-DEXTROAMPHETAMINE 10 MG PO TABS: ORAL_TABLET | ORAL | Status: DC

## 2011-03-15 NOTE — Telephone Encounter (Signed)
Will place up front

## 2011-03-15 NOTE — Telephone Encounter (Signed)
Refill adderall - will pick up Monday [phone]

## 2011-04-09 ENCOUNTER — Ambulatory Visit: Payer: Self-pay | Admitting: Internal Medicine

## 2011-04-09 ENCOUNTER — Encounter: Payer: Self-pay | Admitting: Internal Medicine

## 2011-04-09 ENCOUNTER — Ambulatory Visit (INDEPENDENT_AMBULATORY_CARE_PROVIDER_SITE_OTHER): Payer: BC Managed Care – PPO | Admitting: Internal Medicine

## 2011-04-09 DIAGNOSIS — F909 Attention-deficit hyperactivity disorder, unspecified type: Secondary | ICD-10-CM

## 2011-04-09 MED ORDER — AMPHETAMINE-DEXTROAMPHETAMINE 10 MG PO TABS: ORAL_TABLET | ORAL | Status: DC

## 2011-04-09 NOTE — Progress Notes (Signed)
  Subjective:    Patient ID: [name], female    DOB: 1985/10/09, 25 y.o.   MRN: [id number]  HPI Refill on Adderall, doing well  Past Medical History  Diagnosis Date  . ADD (attention deficit disorder)   . Allergic rhinitis    Past Surgical History  Procedure Date  . 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 Diet remains healthy, she is active     Objective:   Physical Exam  Constitutional: She is oriented to person, place, and time. She appears well-developed and well-nourished. No distress.  HENT:  Head: Normocephalic and atraumatic.  Cardiovascular: Normal rate, regular rhythm and normal heart sounds.   No murmur heard. Pulmonary/Chest: Effort normal and breath sounds normal. No respiratory distress. She has no wheezes. She has no rales.  Neurological: She is alert and oriented to person, place, and time.  Skin: Skin is warm and dry. She is not diaphoretic.  Psychiatric: She has a normal mood and affect. Her behavior is normal. Judgment and thought content normal.          Assessment & Plan:

## 2011-04-09 NOTE — Assessment & Plan Note (Signed)
Doing very well, refill medications, come back in 6 months

## 2011-04-10 ENCOUNTER — Telehealth: Payer: Self-pay | Admitting: Internal Medicine

## 2011-04-10 NOTE — Telephone Encounter (Signed)
Please call her when letter is ready--she needs to add it to her other papers that have to be faxed by 04/29/2011

## 2011-04-10 NOTE — Telephone Encounter (Signed)
Dr.Paz please advise  

## 2011-04-11 ENCOUNTER — Ambulatory Visit (INDEPENDENT_AMBULATORY_CARE_PROVIDER_SITE_OTHER): Payer: BC Managed Care – PPO | Admitting: *Deleted

## 2011-04-11 DIAGNOSIS — Z23 Encounter for immunization: Secondary | ICD-10-CM

## 2011-04-11 NOTE — Telephone Encounter (Signed)
Actually, I reviewed the literature (up-to-date)  she needs a re-vaccination this year if she is going to be at a high risk situation like living [place] campus.  If she is not going to live in campus I'll write a letter excusing the second dose

## 2011-04-11 NOTE — Telephone Encounter (Signed)
Pt agreed to get Menactra.

## 2011-04-23 ENCOUNTER — Other Ambulatory Visit: Payer: Self-pay | Admitting: *Deleted

## 2011-04-23 MED ORDER — ALPRAZOLAM 0.5 MG PO TABS: ORAL_TABLET | ORAL | Status: DC

## 2011-04-23 NOTE — Telephone Encounter (Signed)
Pts mother called and states that her daughter is Psychiatric nurse. Please advise if ok to send in xanax for pt.

## 2011-04-23 NOTE — Telephone Encounter (Signed)
Ok 15 tabs, no RF

## 2011-06-20 ENCOUNTER — Other Ambulatory Visit: Payer: Self-pay | Admitting: Internal Medicine

## 2011-06-20 MED ORDER — AMPHETAMINE-DEXTROAMPHETAMINE 10 MG PO TABS: ORAL_TABLET | ORAL | Status: DC

## 2011-06-20 NOTE — Telephone Encounter (Signed)
Refill adderall - PATIENT MOM WILL PICK UP - Friday [phone]

## 2011-06-20 NOTE — Telephone Encounter (Signed)
Rx printed awaiting signature. 

## 2011-10-08 ENCOUNTER — Telehealth: Payer: Self-pay | Admitting: Internal Medicine

## 2011-10-08 NOTE — Telephone Encounter (Signed)
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 The testing facility needs a copy of her adderall presciption. Please fax [phone]  Also, patient would like a refill on adderall.

## 2011-10-09 MED ORDER — AMPHETAMINE-DEXTROAMPHETAMINE 10 MG PO TABS: ORAL_TABLET | ORAL | Status: DC

## 2011-10-09 NOTE — Telephone Encounter (Signed)
Left detail message,.Rx ready for pick up, Office visit due now and Rx faxed.

## 2011-10-21 ENCOUNTER — Other Ambulatory Visit: Payer: Self-pay | Admitting: Internal Medicine

## 2011-10-21 NOTE — Telephone Encounter (Signed)
LMOM for call back w/contact name & number for clarification on Alprazolam request.

## 2011-10-21 NOTE — Telephone Encounter (Signed)
She does not need Xanax for driving. I have prescribed Xanax in the past for airplane trips. Please clarify

## 2011-10-22 MED ORDER — ALPRAZOLAM 0.5 MG PO TABS: ORAL_TABLET | ORAL | Status: DC

## 2011-10-22 NOTE — Telephone Encounter (Signed)
Ok to Rx #20, no RF, 1 or 2 po qd prn

## 2011-10-22 NOTE — Telephone Encounter (Signed)
Patient will NOT be driving--only a passenger--has panic attacks on [place] roads.

## 2011-10-22 NOTE — Telephone Encounter (Signed)
Done

## 2012-02-19 ENCOUNTER — Telehealth: Payer: Self-pay | Admitting: Internal Medicine

## 2012-02-19 MED ORDER — AMPHETAMINE-DEXTROAMPHETAMINE 10 MG PO TABS: ORAL_TABLET | ORAL | Status: DC

## 2012-02-19 NOTE — Telephone Encounter (Signed)
Ok to refill 

## 2012-02-19 NOTE — Telephone Encounter (Signed)
Pt needs rx for generic adderall 10mg . Mother will come pick up tomorrow after work.

## 2012-02-19 NOTE — Telephone Encounter (Signed)
Ok 1 month supply 

## 2012-02-19 NOTE — Telephone Encounter (Signed)
Refill done.  

## 2012-04-14 ENCOUNTER — Telehealth: Payer: Self-pay | Admitting: Internal Medicine

## 2012-04-14 MED ORDER — AMPHETAMINE-DEXTROAMPHETAMINE 10 MG PO TABS: ORAL_TABLET | ORAL | Status: DC

## 2012-04-14 NOTE — Telephone Encounter (Signed)
Discuss with patient mom 

## 2012-04-14 NOTE — Telephone Encounter (Signed)
Last filled 02-19-12 #90, last OV 04-09-11

## 2012-04-14 NOTE — Telephone Encounter (Signed)
Prescription printed, advised patient she needs a office visit before next refill.

## 2012-04-14 NOTE — Telephone Encounter (Signed)
Patients mother called requesting rx for Adderall. Call [phone] when ready for pick up.

## 2012-05-01 ENCOUNTER — Ambulatory Visit (INDEPENDENT_AMBULATORY_CARE_PROVIDER_SITE_OTHER): Payer: Self-pay | Admitting: Internal Medicine

## 2012-05-01 ENCOUNTER — Encounter: Payer: Self-pay | Admitting: Internal Medicine

## 2012-05-01 VITALS — BP 112/74 | HR 103 | Temp 98.1°F | Wt 128.0 lb

## 2012-05-01 DIAGNOSIS — F909 Attention-deficit hyperactivity disorder, unspecified type: Secondary | ICD-10-CM

## 2012-05-01 MED ORDER — AMPHETAMINE-DEXTROAMPHETAMINE 10 MG PO TABS: ORAL_TABLET | ORAL | Status: DC

## 2012-05-01 NOTE — Progress Notes (Signed)
  Subjective:    Patient ID: [name], female    DOB: [birth year], 25 y.o.   MRN: [id number]  HPI Check up Takes Adderall as needed for ADD, symptoms are well-controlled, no apparent side effects.  Past medical history ADD Allergic rhinitis  Past surgical history Appendectomy  Social history To start law school at [employer] soon tobacco-- no ETOH--  Socially  Drugs -- no   Review of Systems Denies anxiety or depression. No insomnia. Sees gynecology routinely    Objective:   Physical Exam  General -- alert, well-developed, and well-nourished.   Lungs -- normal respiratory effort, no intercostal retractions, no accessory muscle use, and normal breath sounds.   Heart-- normal rate, regular rhythm, no murmur, and no gallop.   Neurologic-- alert & oriented X3 and strength normal in all extremities. Psych-- Cognition and judgment appear intact. Alert and cooperative with normal attention span and concentration.  not anxious appearing and not depressed appearing.       Assessment & Plan:

## 2012-05-01 NOTE — Patient Instructions (Addendum)
Continue taking medications as prescribed, as needed. Call for refills. Next visit by March 2014

## 2012-05-01 NOTE — Assessment & Plan Note (Signed)
Seems to be doing great, refill medications, next visit March 2013. Unrelated to ADHD,I noticed the patient has some sunburn  recommend avoid excessive sun, risk of cancer discussed.

## 2012-06-15 ENCOUNTER — Telehealth: Payer: Self-pay | Admitting: Internal Medicine

## 2012-06-15 NOTE — Telephone Encounter (Signed)
Refill adderall call mom [name] [phone] Last ov 8.2.13 ADHD, last wrt 8.2.13 #90

## 2012-06-15 NOTE — Telephone Encounter (Signed)
Ok to refill 

## 2012-06-16 MED ORDER — AMPHETAMINE-DEXTROAMPHETAMINE 10 MG PO TABS: ORAL_TABLET | ORAL | Status: DC

## 2012-06-16 NOTE — Telephone Encounter (Signed)
Done

## 2012-07-21 ENCOUNTER — Telehealth: Payer: Self-pay | Admitting: *Deleted

## 2012-07-21 MED ORDER — AMPHETAMINE-DEXTROAMPHETAMINE 10 MG PO TABS: ORAL_TABLET | ORAL | Status: DC

## 2012-07-21 NOTE — Telephone Encounter (Signed)
done

## 2012-07-21 NOTE — Telephone Encounter (Signed)
Pt's mom called requesting a refill for adderall 10mg . Last OV 8.2.13. Last filled 9.17.13.

## 2012-09-01 ENCOUNTER — Telehealth: Payer: Self-pay | Admitting: Internal Medicine

## 2012-09-01 MED ORDER — AMPHETAMINE-DEXTROAMPHETAMINE 10 MG PO TABS: ORAL_TABLET | ORAL | Status: DC

## 2012-09-01 NOTE — Telephone Encounter (Signed)
Refill done.  

## 2012-09-01 NOTE — Telephone Encounter (Signed)
Pts mother called requesting rx for adderall. Call [phone] when ready for pick up.

## 2012-09-01 NOTE — Telephone Encounter (Signed)
Ok to refill? Last ov 8.22.13 last filled 10.22.13.

## 2012-09-01 NOTE — Telephone Encounter (Signed)
Ok #90

## 2012-09-11 ENCOUNTER — Telehealth: Payer: Self-pay | Admitting: *Deleted

## 2012-09-11 NOTE — Telephone Encounter (Signed)
Left message to call office

## 2012-09-11 NOTE — Telephone Encounter (Signed)
Pt is about to go on another trip and needs her Rx for xanax filled. .Please advise Pt use [facility]

## 2012-09-12 NOTE — Telephone Encounter (Signed)
Ok #20, no RF 

## 2012-09-14 ENCOUNTER — Telehealth: Payer: Self-pay | Admitting: Internal Medicine

## 2012-09-14 MED ORDER — ALPRAZOLAM 0.5 MG PO TABS: ORAL_TABLET | ORAL | Status: DC

## 2012-09-14 NOTE — Telephone Encounter (Signed)
rx resent to pharmacy

## 2012-09-14 NOTE — Telephone Encounter (Signed)
Rx sent, Left Pt mom detail message.

## 2012-09-14 NOTE — Telephone Encounter (Signed)
per call from pt pharmacy did not recv. Alprazolam -- per brittany she called th pharmacy & they have not received can you please re-send?

## 2012-10-19 ENCOUNTER — Encounter: Payer: Self-pay | Admitting: *Deleted

## 2012-10-19 ENCOUNTER — Other Ambulatory Visit: Payer: Self-pay | Admitting: *Deleted

## 2012-10-19 MED ORDER — AMPHETAMINE-DEXTROAMPHETAMINE 10 MG PO TABS: ORAL_TABLET | ORAL | Status: DC

## 2012-10-19 NOTE — Telephone Encounter (Signed)
Pt made aware rx & controlled substance contract is ready at the front desk.

## 2012-10-19 NOTE — Telephone Encounter (Signed)
Pt's mom called requesting a refill for adderall 10mg . Last OV 8.2.13 Last filled 12.3.13

## 2012-10-19 NOTE — Telephone Encounter (Signed)
Done

## 2012-12-14 ENCOUNTER — Telehealth: Payer: Self-pay | Admitting: Internal Medicine

## 2012-12-14 MED ORDER — AMPHETAMINE-DEXTROAMPHETAMINE 10 MG PO TABS: ORAL_TABLET | ORAL | Status: DC

## 2012-12-14 NOTE — Telephone Encounter (Signed)
Done

## 2012-12-14 NOTE — Telephone Encounter (Signed)
Ok to refill? Last OV 8.2.13 Last filled 1.20.14

## 2012-12-14 NOTE — Telephone Encounter (Signed)
Pt made aware rx is ready to be picked up at front desk.  

## 2012-12-14 NOTE — Telephone Encounter (Signed)
Patient's mother called requesting rx for adderall 10mg . Call [phone]

## 2012-12-15 ENCOUNTER — Encounter: Payer: Self-pay | Admitting: Internal Medicine

## 2013-01-11 ENCOUNTER — Telehealth: Payer: Self-pay | Admitting: Internal Medicine

## 2013-01-11 MED ORDER — AMPHETAMINE-DEXTROAMPHETAMINE 10 MG PO TABS: ORAL_TABLET | ORAL | Status: DC

## 2013-01-11 NOTE — Telephone Encounter (Signed)
Patient's mother called stating the patient needs rx for adderall 10mg . Call [phone] when ready for pick up.

## 2013-01-11 NOTE — Telephone Encounter (Signed)
Done. Be sure we have a urine screening in the chart

## 2013-01-11 NOTE — Telephone Encounter (Signed)
Ok to refill? Last OV 8.2.13 Last filled 3.17.14

## 2013-01-12 NOTE — Telephone Encounter (Signed)
Left detailed msg on pt's vmail to let her know her rx is ready to be picked up at front desk & she needs to be the one to pick up the rx & leave a urine sample.

## 2013-05-17 ENCOUNTER — Telehealth: Payer: Self-pay | Admitting: Internal Medicine

## 2013-05-17 NOTE — Telephone Encounter (Signed)
Ok to refill Xanax and Adderall? Last OV 8.2.13 Last filled Xanax 12.16.13 #20 and no refills Last filled Adderall 4.14.14 #90 and no refills Contract on file, low risk. Due for UDS

## 2013-05-17 NOTE — Telephone Encounter (Signed)
Schedule office visit within a month, she is overdue. UDS when she comesfor an appointment . Ok  Xanax #20 no refills Okay adderall  #90 no refills Please be sure patient knows no further RF w/o OV

## 2013-05-17 NOTE — Telephone Encounter (Signed)
Patient's mother called requesting rx for monthly adderall and a small supply of xanax bc she is flying this weekend. She will pick up hard copies. Call [phone] when ready

## 2013-05-18 MED ORDER — ALPRAZOLAM 0.5 MG PO TABS: ORAL_TABLET | ORAL | Status: DC

## 2013-05-18 MED ORDER — AMPHETAMINE-DEXTROAMPHETAMINE 10 MG PO TABS: ORAL_TABLET | ORAL | Status: DC

## 2013-05-18 NOTE — Telephone Encounter (Signed)
Refill done per orders. Pt. Made aware of need for OV within a month for further refills.

## 2013-08-02 ENCOUNTER — Telehealth: Payer: Self-pay

## 2013-08-02 NOTE — Telephone Encounter (Addendum)
Left message for call back identifiable  Tdap--03/2011 Pap--? Flu vaccine? Unable to reach pre visit

## 2013-08-03 ENCOUNTER — Ambulatory Visit (INDEPENDENT_AMBULATORY_CARE_PROVIDER_SITE_OTHER): Payer: BC Managed Care – PPO | Admitting: Internal Medicine

## 2013-08-03 ENCOUNTER — Encounter: Payer: Self-pay | Admitting: Internal Medicine

## 2013-08-03 VITALS — BP 144/87 | HR 78 | Temp 98.3°F

## 2013-08-03 DIAGNOSIS — F909 Attention-deficit hyperactivity disorder, unspecified type: Secondary | ICD-10-CM

## 2013-08-03 DIAGNOSIS — Z0000 Encounter for general adult medical examination without abnormal findings: Secondary | ICD-10-CM

## 2013-08-03 LAB — CBC WITH DIFFERENTIAL/PLATELET
Basophils Absolute: 0 10*3/uL (ref 0.0–0.1)
Basophils Relative: 0.4 % (ref 0.0–3.0)
Eosinophils Absolute: 0 10*3/uL (ref 0.0–0.7)
Eosinophils Relative: 0.4 % (ref 0.0–5.0)
HCT: 36.4 % (ref 36.0–46.0)
Hemoglobin: 12.7 g/dL (ref 12.0–15.0)
Lymphocytes Relative: 30.9 % (ref 12.0–46.0)
Lymphs Abs: 2.1 10*3/uL (ref 0.7–4.0)
MCV: 86.3 fl (ref 78.0–100.0)
Monocytes Relative: 6.7 % (ref 3.0–12.0)
Neutro Abs: 4.3 10*3/uL (ref 1.4–7.7)
Platelets: 320 10*3/uL (ref 150.0–400.0)
RBC: 4.22 Mil/uL (ref 3.87–5.11)
WBC: 6.9 10*3/uL (ref 4.5–10.5)

## 2013-08-03 LAB — LIPID PANEL
HDL: 46.5 mg/dL (ref >39.00)
LDL Cholesterol: 83 mg/dL (ref 0–99)
Total CHOL/HDL Ratio: 3
Triglycerides: 142 mg/dL (ref 0.0–149.0)
VLDL: 28.4 mg/dL (ref 0.0–40.0)

## 2013-08-03 LAB — COMPREHENSIVE METABOLIC PANEL
ALT: 19 U/L (ref 0–35)
Albumin: 4.1 g/dL (ref 3.5–5.2)
Alkaline Phosphatase: 23 U/L — ABNORMAL LOW (ref 39–117)
BUN: 6 mg/dL (ref 6–23)
CO2: 26 mEq/L (ref 19–32)
Calcium: 9 mg/dL (ref 8.4–10.5)
Chloride: 101 mEq/L (ref 96–112)
Creatinine, Ser: 0.7 mg/dL (ref 0.4–1.2)
GFR: 104.7 mL/min (ref >60.00)
Glucose, Bld: 78 mg/dL (ref 70–99)
Potassium: 3 mEq/L — ABNORMAL LOW (ref 3.5–5.1)
Sodium: 135 mEq/L (ref 135–145)
Total Bilirubin: 1 mg/dL (ref 0.3–1.2)
Total Protein: 7.2 g/dL (ref 6.0–8.3)

## 2013-08-03 LAB — TSH: TSH: 1.14 u[IU]/mL (ref 0.35–5.50)

## 2013-08-03 MED ORDER — AMPHETAMINE-DEXTROAMPHETAMINE 10 MG PO TABS: ORAL_TABLET | ORAL | Status: DC

## 2013-08-03 NOTE — Patient Instructions (Signed)
Get your blood work before you leave  Next visit in 1 year for a physical exam  . Fasting Please make an appointment     

## 2013-08-03 NOTE — Assessment & Plan Note (Signed)
Td 2012 menactra 2012 Sees gyn Diet-exercise discussed  Labs Also some problems w/ her feet, rec OTC arch support, if not helping, will call for referral to Dr Michiel Sites

## 2013-08-03 NOTE — Progress Notes (Signed)
Subjective:     Patient ID: [name], female   DOB: 1986/06/17, 27 y.o.   MRN: [id number]  HPI Complete physical exam In the summer, she is very active and on ger feet a lot, noted her feet archs "are dropping", wonders if she needs arch support insert  Past Medical History  Diagnosis Date  . ADD (attention deficit disorder)   . Allergic rhinitis    Past Surgical History  Procedure Laterality Date  . Appendectomy     History   Social History  . Marital Status: Single    Spouse Name: N/A    Number of Children: 0  . Years of Education: N/A   Occupational History  . attendingt law school at [employer]     Social History Main Topics  . Smoking status: Never Smoker   . Smokeless tobacco: Never Used  . Alcohol Use: Yes     Comment: socially   . Drug Use: No  . Sexual Activity: Not on file   Other Topics Concern  . 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 Grad school         Family History  Problem Relation Age of Onset  . Hypertension Father   . Hyperlipidemia Father   . Hypertension Mother   . Diabetes Mother   . Thyroid disease Mother   . Asthma Mother   . Colon cancer Neg Hx   . Breast cancer Neg Hx        Review of Systems Diet-- average Exercise-- gym once a week No  CP, SOB, lower extremity edema Denies  nausea, vomiting diarrhea Denies  blood in the stools No dysuria, gross hematuria, difficulty urinating   No anxiety, depression     Objective:   Physical Exam BP 144/87  Pulse 78  Temp(Src) 98.3 F (36.8 C)  SpO2 100% General -- alert, well-developed, NAD.  Neck --no thyromegaly  Lungs -- normal respiratory effort, no intercostal retractions, no accessory muscle use, and normal breath sounds.  Heart-- normal rate, regular rhythm, no murmur.  Abdomen-- Not distended, good bowel sounds,soft, non-tender.  Extremities-- no pretibial edema  bilaterally;Feet normal to inspection and  palpation  Neurologic--  alert & oriented X3. Speech normal, gait normal, strength normal in all extremities.   Psych-- Cognition and judgment appear intact. Cooperative with normal attention span and concentration. No anxious appearing , no depressed appearing.      Assessment:         Plan:

## 2013-08-03 NOTE — Assessment & Plan Note (Signed)
Symptoms well controlled with current medications, will get a UDS, refill provided today, next visit in one year.

## 2013-08-04 ENCOUNTER — Encounter: Payer: Self-pay | Admitting: Internal Medicine

## 2013-08-06 ENCOUNTER — Encounter: Payer: Self-pay | Admitting: *Deleted

## 2013-10-11 ENCOUNTER — Telehealth: Payer: Self-pay | Admitting: Internal Medicine

## 2013-10-11 MED ORDER — AMPHETAMINE-DEXTROAMPHETAMINE 10 MG PO TABS: ORAL_TABLET | ORAL | Status: DC

## 2013-10-11 NOTE — Addendum Note (Signed)
Addended by: Willow OraPAZ, JOSE E on: 10/11/2013 01:00 PM   Modules accepted: Orders

## 2013-10-11 NOTE — Telephone Encounter (Signed)
Patient's mother is calling on her behalf to request a refill on the pt's Adderall rx. She is asking that we call her work number when ready. Wants to pick up when ready.

## 2013-10-11 NOTE — Telephone Encounter (Signed)
Rx printed. Ask the toxicology staff to print her latest UDS (I ordered one 07-2013)

## 2013-10-11 NOTE — Telephone Encounter (Signed)
rx refill- adderall 10 mg Last OV- 08/03/13 Last refilled- 08/03/13 #90 / 0 rf  UDS - 11/06/12 LOW

## 2013-10-14 ENCOUNTER — Telehealth: Payer: Self-pay

## 2013-10-14 NOTE — Telephone Encounter (Signed)
UDS: 08/04/2013 Positive Low Risk--per Dr Drue NovelPaz

## 2013-11-29 ENCOUNTER — Telehealth: Payer: Self-pay | Admitting: Internal Medicine

## 2013-11-29 MED ORDER — AMPHETAMINE-DEXTROAMPHETAMINE 10 MG PO TABS: ORAL_TABLET | ORAL | Status: DC

## 2013-11-29 MED ORDER — ALPRAZOLAM 0.5 MG PO TABS: ORAL_TABLET | ORAL | Status: DC

## 2013-11-29 NOTE — Telephone Encounter (Signed)
Scripts put up front for pick up. Patient aware. JG//CMA

## 2013-11-29 NOTE — Telephone Encounter (Signed)
ALPRAZolam (XANAX) 0.5 MG tablet Last refill: 05/18/2014 #20, 0 refills  amphetamine-dextroamphetamine (ADDERALL) 10 MG tablet 2 in AM and 1 in PM Last refill: 10/11/2013 #90, 0 refills  Last OV: 08/03/2013 UDS up-to-date, low risk

## 2013-11-29 NOTE — Telephone Encounter (Addendum)
Mother called and requested a refill for her daughter. She needs refills for Alprazolam (XANAX) 0.5 MG tablet and amphetamine-dextroamphetamine (ADDERALL) 10 MG tablet. Also her mother wanted to know that she is going to be flying and that's why she needed the xanxa refilled. Patient mother will pick up prescriptions.

## 2013-11-29 NOTE — Telephone Encounter (Signed)
RF done. .

## 2014-02-04 ENCOUNTER — Telehealth: Payer: Self-pay | Admitting: Internal Medicine

## 2014-02-04 MED ORDER — AMPHETAMINE-DEXTROAMPHETAMINE 10 MG PO TABS: ORAL_TABLET | ORAL | Status: DC

## 2014-02-04 NOTE — Telephone Encounter (Signed)
LMOM @ (4:59pm) informing the pt's mother that the Adderall was approved and it will be left up front for her to pick-up on Monday.//AB/CMA

## 2014-02-04 NOTE — Telephone Encounter (Signed)
Caller name:Barbara Alvira Relation to ZO:XWRUEApt:mother Call back [phone] Pharmacy:  Reason for call: patient mother called to request a refill for adderall for her daughter. Patient's mother states that her daughter is moving to [place] today and really needs this refilled before she leaves today.

## 2014-02-04 NOTE — Telephone Encounter (Signed)
Ok   RF Arrange UDS at her earliest convenience

## 2014-02-04 NOTE — Telephone Encounter (Signed)
Requesting Adderall 10mg -Take 2 tablets by mouth in the am,1 tablet by mouth in pm prn. Last refill:11-29-13;#90,0 Last OV:08-03-13 UDS:08-03-13-Low-DUE Please advise.//AB/CMA

## 2014-06-09 ENCOUNTER — Telehealth: Payer: Self-pay | Admitting: Internal Medicine

## 2014-06-09 NOTE — Telephone Encounter (Signed)
Caller name: [name] Relation to pt: mom Call back number: [phone] Pharmacy:  Reason for call:    Patient mom is requesting a new adderall and xanax rx. She would like to pick up both prescriptions.

## 2014-06-09 NOTE — Telephone Encounter (Signed)
Pts mother, [name] is requesting refill for Pt, Alprazolam and Adderall  Last OV: 08/03/2013  Last fill on Adderall: 02/04/2014 # 90 with 0 RF Last fill on Alprazolam: 11/29/2013 # 20 with 0 RF  Last UDS: 08/04/2013 Low Risk  Please advise.

## 2014-06-10 MED ORDER — AMPHETAMINE-DEXTROAMPHETAMINE 10 MG PO TABS: ORAL_TABLET | ORAL | Status: DC

## 2014-06-10 MED ORDER — ALPRAZOLAM 0.5 MG PO TABS: ORAL_TABLET | ORAL | Status: DC

## 2014-06-10 NOTE — Telephone Encounter (Signed)
LMOM for Pt, medication placed at front desk for pick up.   

## 2014-06-10 NOTE — Telephone Encounter (Signed)
done

## 2014-08-24 ENCOUNTER — Ambulatory Visit (INDEPENDENT_AMBULATORY_CARE_PROVIDER_SITE_OTHER): Payer: BC Managed Care – PPO | Admitting: Internal Medicine

## 2014-08-24 ENCOUNTER — Encounter: Payer: Self-pay | Admitting: Internal Medicine

## 2014-08-24 VITALS — BP 108/68 | HR 80 | Temp 98.7°F | Wt 128.4 lb

## 2014-08-24 DIAGNOSIS — E876 Hypokalemia: Secondary | ICD-10-CM

## 2014-08-24 DIAGNOSIS — F902 Attention-deficit hyperactivity disorder, combined type: Secondary | ICD-10-CM

## 2014-08-24 LAB — BASIC METABOLIC PANEL
BUN: 11 mg/dL (ref 6–23)
CHLORIDE: 103 meq/L (ref 96–112)
CO2: 29 meq/L (ref 19–32)
CREATININE: 0.68 mg/dL (ref 0.50–1.10)
Calcium: 9 mg/dL (ref 8.4–10.5)
GLUCOSE: 77 mg/dL (ref 70–99)
POTASSIUM: 3.9 meq/L (ref 3.5–5.3)
Sodium: 139 mEq/L (ref 135–145)

## 2014-08-24 MED ORDER — AMPHETAMINE-DEXTROAMPHETAMINE 10 MG PO TABS: ORAL_TABLET | ORAL | Status: DC

## 2014-08-24 NOTE — Assessment & Plan Note (Signed)
Symptoms well-controlled, will get a UDS, 2 prescriptions provided

## 2014-08-24 NOTE — Progress Notes (Signed)
   Subjective:    Patient ID: Ermalene PostinKatie Checketts, female    DOB: 1986/05/08, 28 y.o.   MRN: [id number]  DOS:  08/24/2014 Type of visit - description : rov Interval history: History of ADHD, good compliance w/ medication, it works well for her. She took Adderall yesterday and this morning  ROS Denies problems with decreased appetite or unexplained weight loss or gain. Nan No anxiety or depression  Past Medical History  Diagnosis Date  . ADD (attention deficit disorder)   . Allergic rhinitis     Past Surgical History  Procedure Laterality Date  . Appendectomy      History   Social History  . Marital Status: Single    Spouse Name: N/A    Number of Children: 0  . Years of Education: N/A   Occupational History  . attendingt law school at [employer]     Social History Main Topics  . Smoking status: Never Smoker   . Smokeless tobacco: Never Used  . Alcohol Use: Yes     Comment: socially   . Drug Use: No  . Sexual Activity: Not on file   Other Topics Concern  . Not on file   Social History Narrative   Graduated from college at [employer]    Finishing law school soon        Medication List       This list is accurate as of: 08/24/14 11:59 PM.  Always use your most recent med list.               ALPRAZolam 0.5 MG tablet  Commonly known as:  XANAX  1 or 2 by mouth daily as needed for traveling anxiety.     amphetamine-dextroamphetamine 10 MG tablet  Commonly known as:  ADDERALL  2 by mouth in the am, 1 by mouth in pm prn.     IUD'S IU  by Intrauterine route.           Objective:   Physical Exam BP 108/68 mmHg  Pulse 80  Temp(Src) 98.7 F (37.1 C) (Oral)  Wt 128 lb 6 oz (58.231 kg)  SpO2 98%  LMP   General -- alert, well-developed, NAD.  Neck --no thyromegaly  HEENT-- Not pale.  Lungs -- normal respiratory effort, no intercostal retractions, no accessory muscle use, and normal breath sounds.  Heart-- normal rate, regular rhythm, no murmur.    Extremities-- no pretibial edema bilaterally  Neurologic--  alert & oriented X3. Speech normal, gait appropriate for age, strength symmetric and appropriate for age.  Psych-- Cognition and judgment appear intact. Cooperative with normal attention span and concentration. No anxious or depressed appearing.      Assessment & Plan:    Hypokalemia, Potassium was low the last time he was checked, recheck today

## 2014-08-24 NOTE — Patient Instructions (Signed)
Get your blood work before you leave  We also need a UDS   Please come back to the office in 1 year for a routine check up

## 2014-08-24 NOTE — Progress Notes (Signed)
Pre visit review using our clinic review tool, if applicable. No additional management support is needed unless otherwise documented below in the visit note. 

## 2014-09-12 ENCOUNTER — Telehealth: Payer: Self-pay

## 2014-09-12 MED ORDER — ALPRAZOLAM 0.5 MG PO TABS: ORAL_TABLET | ORAL | Status: DC

## 2014-09-12 NOTE — Telephone Encounter (Signed)
done

## 2014-09-12 NOTE — Telephone Encounter (Signed)
Pt is requesting refill on Alprazolam.  Last OV: 08/24/2014 Last Fill: 06/10/2014 # 20 0RF UDS: 08/24/2014 Low risk  Please advise.

## 2014-09-12 NOTE — Telephone Encounter (Signed)
UDS: 08/24/2014  Positive for Adderall  Negative for Xanax PRN  Low risk per Dr. Drue NovelPaz 09/09/2014

## 2014-09-12 NOTE — Telephone Encounter (Signed)
Fatima BlankBarbara Mom [phone] [id number] [facility] [place]  Clarivel needs her ALPRAZolam Prudy Feeler(XANAX) 0.5 MG tablet refilled for [place]

## 2014-09-12 NOTE — Telephone Encounter (Signed)
[facility] [address] as requested.

## 2014-09-21 ENCOUNTER — Encounter: Payer: Self-pay | Admitting: Internal Medicine

## 2014-10-19 ENCOUNTER — Telehealth: Payer: Self-pay | Admitting: Internal Medicine

## 2014-10-19 MED ORDER — AMPHETAMINE-DEXTROAMPHETAMINE 10 MG PO TABS: ORAL_TABLET | ORAL | Status: DC

## 2014-10-19 NOTE — Telephone Encounter (Signed)
Spoke with Pts mom, [name], informed her rx is ready for pick up. Informed her we may be closing early due to impending snow storm. [name] verbalized understanding.

## 2014-10-19 NOTE — Telephone Encounter (Signed)
Pt is requesting refill on Adderall.  Last OV: 08/24/2014 Last Fill: 08/24/2014 # 90 0RF UDS: 08/24/2014 Low risk  Please advise.

## 2014-10-19 NOTE — Telephone Encounter (Signed)
Done  X 3 RF

## 2014-10-19 NOTE — Telephone Encounter (Signed)
Caller name: [name] Relation to pt: mom Call back number: [phone] Pharmacy:  Reason for call:   Requesting adderall rx

## 2015-01-11 ENCOUNTER — Telehealth: Payer: Self-pay | Admitting: Internal Medicine

## 2015-01-11 DIAGNOSIS — Z7689 Persons encountering health services in other specified circumstances: Secondary | ICD-10-CM

## 2015-01-11 NOTE — Telephone Encounter (Signed)
Forms are currently with Dr. Drue NovelPaz.

## 2015-01-11 NOTE — Telephone Encounter (Signed)
Relation to pt: self  Call back number: [phone]   Reason for call:  Pt faxed over forms 01/10/15 requesting MD signature pt checking on the status if forms were received. Please advise

## 2015-01-11 NOTE — Telephone Encounter (Signed)
Called and lm for pt informing her that we did receive them and would call her when they are completed.

## 2015-01-12 NOTE — Telephone Encounter (Signed)
Forms completed. Pt aware. JG//CMA

## 2015-02-22 ENCOUNTER — Telehealth: Payer: Self-pay | Admitting: Internal Medicine

## 2015-02-22 MED ORDER — AMPHETAMINE-DEXTROAMPHETAMINE 10 MG PO TABS: ORAL_TABLET | ORAL | Status: DC

## 2015-02-22 NOTE — Telephone Encounter (Signed)
Okay #90. 

## 2015-02-22 NOTE — Telephone Encounter (Signed)
Caller name:MARGARET Relationship to patient: MOTHER Can be [phone] Pharmacy:  Reason for call:WANTS TO PICK UP Adalie'S ADDERALL RX

## 2015-02-22 NOTE — Telephone Encounter (Signed)
Please call mother [phone]

## 2015-02-22 NOTE — Telephone Encounter (Signed)
Rx printed, awaiting MD signature.  

## 2015-02-22 NOTE — Telephone Encounter (Signed)
Pt's mother, [name], requesting refill on Adderall for Pt.  Last OV: 08/24/2014 Last Fill: 10/19/2014 #90 0RF UDS: 08/24/2014 Low risk  Please advise.

## 2015-02-23 NOTE — Telephone Encounter (Signed)
Please inform Pt's mother, [name] that Rx is ready for pick up at her convenience. Thanks.

## 2015-02-23 NOTE — Telephone Encounter (Signed)
LVM advising mother RX is ready to be picked up

## 2015-03-16 ENCOUNTER — Telehealth: Payer: Self-pay | Admitting: Internal Medicine

## 2015-03-16 ENCOUNTER — Other Ambulatory Visit: Payer: Self-pay

## 2015-03-16 MED ORDER — ALPRAZOLAM 0.5 MG PO TABS: 0.5000 mg | ORAL_TABLET | Freq: Every day | ORAL | Status: DC | PRN

## 2015-03-16 MED ORDER — SCOPOLAMINE 1 MG/3DAYS TD PT72: 1.0000 | MEDICATED_PATCH | TRANSDERMAL | Status: DC

## 2015-03-16 MED ORDER — CIPROFLOXACIN HCL 500 MG PO TABS: 500.0000 mg | ORAL_TABLET | Freq: Two times a day (BID) | ORAL | Status: DC

## 2015-03-16 NOTE — Telephone Encounter (Signed)
Pt traveling to [place], requesting refill on Xanax. Pt last fill on Xanax was on 09/12/2014 # 20 tabs and 0 refills. Also requesting Cipro and a motion sickness patch? Please advise when ready, Pt's mom would like to pick up from office and take to pharmacy.

## 2015-03-16 NOTE — Telephone Encounter (Signed)
Rx's for Xanax and Cipro printed, awaiting MD signature.

## 2015-03-16 NOTE — Telephone Encounter (Signed)
Pts mother asked to p/u all RXs. Please cancel the order for the patch sent to [facility]. She wants all 3 written RXs because pt is in [facility] [facility].

## 2015-03-16 NOTE — Telephone Encounter (Signed)
1. Okay Xanax No. 20, no refills 2. Cipro 500 mg one by mouth twice a day #6, no refills, advise patient or mother: only to be taken if she has diarrhea while in [place]. If she has severe symptoms, needs to see a local doctor. Also if she takes ciprofloxacin she does not need to be exposed to the sun, may have a reaction 3. For motion sickness, I am sending a prescription for the transdermal patch. Advised patient or mother: Recommend to try it before she goes to [place] to be sure she does not have side effects.

## 2015-03-16 NOTE — Telephone Encounter (Signed)
LMOM informing Pt's mother, [name], that Rx for Cipro and Xanax are ready for pick up at front desk. Informed her that Dr. Drue Novel did send in the motion sickness patch into the [facility] on [address]. Instructed [name] to let Pt know of recommendations on taking Cipro and motion sickness patch. Instructed Pt and/or [name] to call if she has any further questions.

## 2015-03-16 NOTE — Telephone Encounter (Signed)
Caller name: Vona Rafanan Relationship to patient: mother Can be reached: [phone]  Pharmacy: mother will pick up RXs  Reason for call: Pt is going to be traveling and is requesting Xanax for plane trips. She is also going to [place] and is wanting an RX for Cipro and for sea sickness patches. Please call pt cell # [phone] with questions about medications. Please call mom's # to pick up RXs.

## 2015-03-16 NOTE — Telephone Encounter (Signed)
Called [facility] to cancel prescription for patch, Rx printed and signed by MD and placed at front desk with other prescriptions for pick up.

## 2015-03-16 NOTE — Telephone Encounter (Signed)
Notified pt mother 3 RXs are at front desk

## 2015-08-09 ENCOUNTER — Telehealth: Payer: Self-pay | Admitting: Internal Medicine

## 2015-08-09 MED ORDER — ALPRAZOLAM 0.5 MG PO TABS: 0.5000 mg | ORAL_TABLET | Freq: Every day | ORAL | Status: DC | PRN

## 2015-08-09 NOTE — Telephone Encounter (Signed)
Okay #20, no refills 

## 2015-08-09 NOTE — Telephone Encounter (Signed)
Rx printed, awaiting MD signature.  

## 2015-08-09 NOTE — Telephone Encounter (Signed)
Pt's mother, [name] requesting refills on Alprazolam for Pt.  Last OV: 08/24/2014, no future appt scheduled Last Fill: 03/16/2015 #20 and 0RF UDS: 08/24/2014 Low risk   Please advise.

## 2015-08-09 NOTE — Telephone Encounter (Signed)
Caller name: [name]  Relationship to patient: Mother  Can be reached: [phone]  Reason for call: Patient is traveling out of town and needs ALPRAZolam Prudy Feeler(XANAX) 0.5 MG tablet. Mother will pick up Rx when it is ready

## 2015-08-10 NOTE — Telephone Encounter (Signed)
Spoke with [name], informed her that Rx is ready for pick up at the front desk at her convenience.

## 2015-10-18 ENCOUNTER — Telehealth: Payer: Self-pay | Admitting: Internal Medicine

## 2015-10-18 MED ORDER — AMPHETAMINE-DEXTROAMPHETAMINE 10 MG PO TABS: ORAL_TABLET | ORAL | Status: DC

## 2015-10-18 NOTE — Telephone Encounter (Signed)
Pt's mother, [name], called requesting refill on Adderall for Pt.  Last OV: 08/24/2014, CPE on 11/27/2015 Last Fill: 02/22/2015 #90 and 0RF UDS: 08/24/2014 Low risk  Please advise.

## 2015-10-18 NOTE — Telephone Encounter (Signed)
Spoke with [name], Pt's Mom, informed her that Rx is ready for pick up at front desk.

## 2015-10-18 NOTE — Telephone Encounter (Signed)
Caller name: [name] Relationship to patient: Mother Can be reached: [phone]  Reason for call: Mother request refill on amphetamine-dextroamphetamine (ADDERALL) 10 MG tablet [[id number]]

## 2015-10-18 NOTE — Telephone Encounter (Signed)
Ok 90, no RF 

## 2015-10-18 NOTE — Telephone Encounter (Signed)
Rx printed, awaiting MD signature.  

## 2015-10-23 ENCOUNTER — Ambulatory Visit (INDEPENDENT_AMBULATORY_CARE_PROVIDER_SITE_OTHER): Payer: BLUE CROSS/BLUE SHIELD | Admitting: Internal Medicine

## 2015-10-23 ENCOUNTER — Encounter: Payer: Self-pay | Admitting: Internal Medicine

## 2015-10-23 VITALS — BP 108/74 | HR 87 | Temp 98.3°F | Ht 64.0 in | Wt 136.4 lb

## 2015-10-23 DIAGNOSIS — F902 Attention-deficit hyperactivity disorder, combined type: Secondary | ICD-10-CM | POA: Diagnosis not present

## 2015-10-23 MED ORDER — AMPHETAMINE-DEXTROAMPHETAMINE 10 MG PO TABS: ORAL_TABLET | ORAL | Status: DC

## 2015-10-23 NOTE — Progress Notes (Signed)
Pre visit review using our clinic review tool, if applicable. No additional management support is needed unless otherwise documented below in the visit note. 

## 2015-10-23 NOTE — Progress Notes (Signed)
   Subjective:    Patient ID: [name], female    DOB: 1986-02-03, 30 y.o.   MRN: [id number]  DOS:  10/23/2015 Type of visit - description : Routine checkup  interval history:  ADD: Well-controlled with Adderall as prescribed, has been taking it very sporadically   Anxiety: Usually associated with travel, rarely takes any medication for it.  Review of Systems No problems with depression at this point. Sleeping well. some stress.  No apparent side effects from medications  Past Medical History  Diagnosis Date  . ADD (attention deficit disorder)   . Allergic rhinitis     Past Surgical History  Procedure Laterality Date  . Appendectomy      Social History   Social History  . Marital Status: Single    Spouse Name: N/A  . Number of Children: 0  . Years of Education: N/A   Occupational History  . attendingt law school at [employer]     Social History Main Topics  . Smoking status: Never Smoker   . Smokeless tobacco: Never Used  . Alcohol Use: Yes     Comment: socially   . Drug Use: No  . Sexual Activity: Not on file   Other Topics Concern  . Not on file   Social History Narrative   Graduated from college at [employer] ,    [employer] law school    Lives in [place]        Medication List       This list is accurate as of: 10/23/15  5:14 PM.  Always use your most recent med list.               ALPRAZolam 0.5 MG tablet  Commonly known as:  XANAX  Take 1-2 tablets (0.5-1 mg total) by mouth daily as needed for anxiety (and traveling).     amphetamine-dextroamphetamine 10 MG tablet  Commonly known as:  ADDERALL  Take 2 tablets by mouth in the morning, and 1 tablet by mouth in evening as needed.     IUD'S IU  by Intrauterine route.     scopolamine 1 MG/3DAYS  Commonly known as:  TRANSDERM-SCOP (1.5 MG)  Place 1 patch (1.5 mg total) onto the skin every 3 (three) days.           Objective:   Physical Exam BP 108/74 mmHg  Pulse 87  Temp(Src) 98.3  F (36.8 C) (Oral)  Ht  (1.626 m)  Wt 136 lb 6 oz (61.859 kg)  BMI 23.40 kg/m2  SpO2 97% General:   Well developed, well nourished . NAD.  HEENT:  Normocephalic . Face symmetric, atraumatic Skin: Not pale. Not jaundice Neurologic:  alert & oriented X3.  Speech normal, gait appropriate for age and unassisted Psych--  Cognition and judgment appear intact.  Cooperative with normal attention span and concentration.  Behavior appropriate. No anxious or depressed appearing.      Assessment & Plan:   Assessment: ADD Allergic Rhinitis   PLAN ADD: Doing well, continue Adderall, contract signed, UDS on return to the office. Prescriptions provided, will call for refills, okay mom to pick up prescriptions. Primary care: Sees gynecology regularly RTC 8 months.

## 2015-10-23 NOTE — Patient Instructions (Signed)
BEFORE YOU LEAVE THE OFFICE: GO TO THE FRONT DESK  Schedule a routine office visit or check up to be done in  8 months  No  fasting     AFTER YOU LEAVE THE OFFICE:  call for refills as needed

## 2015-11-27 ENCOUNTER — Encounter: Payer: Self-pay | Admitting: Internal Medicine

## 2015-12-21 ENCOUNTER — Telehealth: Payer: Self-pay | Admitting: Internal Medicine

## 2015-12-21 MED ORDER — ALPRAZOLAM 0.5 MG PO TABS: 0.5000 mg | ORAL_TABLET | Freq: Every day | ORAL | Status: DC | PRN

## 2015-12-21 NOTE — Telephone Encounter (Signed)
Rx printed, awaiting MD signature.  

## 2015-12-21 NOTE — Telephone Encounter (Signed)
Caller name: [name] Relationship to patient: Mother Can be reached: [phone]   Reason for call: Needs rx for Xanax because she is traveling next week. States that Dr.Paz knows that she has problems with flying. Mom will pick rx up this afternoon

## 2015-12-21 NOTE — Telephone Encounter (Signed)
Please inform Pt's mother [name], that Rx has been placed at front desk for pick up. Thank you.

## 2015-12-21 NOTE — Telephone Encounter (Signed)
Pt's mother, [name], is requesting refill on Alprazolam for Pt.   Last OV: 10/23/2015 Last Fill: 08/09/2015 #20 and 0RF  UDS: 08/24/2014 Low risk  Please advise.

## 2015-12-21 NOTE — Telephone Encounter (Signed)
Okay #30, no refill 

## 2016-03-05 ENCOUNTER — Telehealth: Payer: Self-pay | Admitting: Internal Medicine

## 2016-03-05 MED ORDER — AMPHETAMINE-DEXTROAMPHETAMINE 10 MG PO TABS: ORAL_TABLET | ORAL | Status: DC

## 2016-03-05 NOTE — Telephone Encounter (Signed)
[name] - Mother - [phone]   Refill request for Adderall

## 2016-03-05 NOTE — Telephone Encounter (Signed)
Rx printed, awaiting MD signature.  

## 2016-03-05 NOTE — Telephone Encounter (Signed)
Pt's mother, [name], called requesting refill on Adderall.  Last OV: 10/23/2015 Last Fill: 10/23/2015 #90 and 0RF Pt sig: 2 tablets in AM and 1 tablet in PM UDS: 08/24/2014 Low risk  Please advise.

## 2016-03-05 NOTE — Telephone Encounter (Signed)
Spoke w/ [name], Pt's mother, informed her that Rx has been placed at front desk for pick up at her convenience. [name] verbalized understanding.

## 2016-03-05 NOTE — Telephone Encounter (Signed)
Okay #90, no refills 

## 2016-03-28 ENCOUNTER — Telehealth: Payer: Self-pay | Admitting: Internal Medicine

## 2016-03-28 MED ORDER — ALPRAZOLAM 0.5 MG PO TABS: 0.5000 mg | ORAL_TABLET | Freq: Every day | ORAL | Status: DC | PRN

## 2016-03-28 NOTE — Telephone Encounter (Signed)
Caller name:Kirchoff,Barbara Relation to WU:JWJXBJpt:mother  Call back [phone]   Reason for call:  Mother requesting ALPRAZolam (XANAX) 0.5 MG tablet due to patient flying this weekend. Mother would like to pick up prescription. Please advise

## 2016-03-28 NOTE — Telephone Encounter (Signed)
Please inform Pt's mother, [name], that Rx is ready for pick up at front desk. Thank you.

## 2016-03-28 NOTE — Telephone Encounter (Signed)
Rx printed, awaiting MD signature.  

## 2016-03-28 NOTE — Telephone Encounter (Signed)
Pt's mother, [name], is requesting refill on Alprazolam for Pt.  Last OV: 10/23/2015  Last Fill: 12/21/2015 #30 and 0RF Pt sig: 1-2 qhs prn UDS: 08/24/2014 Low risk  Please advise .

## 2016-03-28 NOTE — Telephone Encounter (Signed)
Okay #30, no refills 

## 2016-03-29 NOTE — Telephone Encounter (Addendum)
Mother informed.

## 2016-06-07 ENCOUNTER — Telehealth: Payer: Self-pay | Admitting: Internal Medicine

## 2016-06-07 MED ORDER — ALPRAZOLAM 0.5 MG PO TABS: 0.5000 mg | ORAL_TABLET | Freq: Every day | ORAL | 0 refills | Status: DC | PRN

## 2016-06-07 MED ORDER — AMPHETAMINE-DEXTROAMPHETAMINE 10 MG PO TABS: ORAL_TABLET | ORAL | 0 refills | Status: DC

## 2016-06-07 NOTE — Telephone Encounter (Signed)
Last Alprazolam RX:  03/28/16, #30 Last Adderall RX:  03/05/16, #90 UDS: Low risk, last in 2015 Last OV:  10/2015 Next OV:  None scheduled.  Melissa-- please advise in PCP's absence?

## 2016-06-07 NOTE — Telephone Encounter (Signed)
Rx's placed at front desk and notified pt's mom.

## 2016-06-07 NOTE — Telephone Encounter (Signed)
See rxs

## 2016-06-07 NOTE — Telephone Encounter (Signed)
Caller name:Peretti,Barbara Relation to pt: mother  Call back number: [phone]    Reason for call:  Mother requesting refill amphetamine-dextroamphetamine (ADDERALL) 10 MG tablet and ALPRAZolam (XANAX) 0.5 MG tablet. Mother states [place] going [place]orette party and would like to pick up Rx today. Advised PCP is out of the office. Please advise

## 2016-10-28 DIAGNOSIS — Z975 Presence of (intrauterine) contraceptive device: Secondary | ICD-10-CM | POA: Insufficient documentation

## 2016-12-04 ENCOUNTER — Telehealth: Payer: Self-pay | Admitting: Internal Medicine

## 2016-12-04 LAB — HM PAP SMEAR: HM Pap smear: NORMAL

## 2016-12-04 NOTE — Telephone Encounter (Signed)
Pt's mother calling requesting refill on Adderall for Pt.  Last OV: 10/23/2015, no future appt scheduled Last Fill: 06/07/2016 #90 and 0RF UDS: 08/24/2014 Low risk  Please advise.

## 2016-12-04 NOTE — Telephone Encounter (Signed)
Caller name:Demicco,Barbara Relation to pt: mother  Call back number:[phone]   Reason for call:  Mother would like to pick up amphetamine-dextroamphetamine (ADDERALL) 10 MG tablet tomorrow by 3:15pm, due to patient going out of the country.

## 2016-12-05 MED ORDER — AMPHETAMINE-DEXTROAMPHETAMINE 10 MG PO TABS: ORAL_TABLET | ORAL | 0 refills | Status: DC

## 2016-12-05 NOTE — Telephone Encounter (Signed)
Rx printed, awaiting MD signature.  

## 2016-12-05 NOTE — Telephone Encounter (Signed)
Please inform Pt and/or her mother, [name] that Rx has been placed at front desk for pick up. Thank you.

## 2016-12-05 NOTE — Telephone Encounter (Signed)
Mother informed.

## 2016-12-05 NOTE — Telephone Encounter (Signed)
Please advise patient's mother, will RF # 90. No further refills without office visit

## 2017-06-26 ENCOUNTER — Encounter: Payer: Self-pay | Admitting: Internal Medicine

## 2017-06-27 ENCOUNTER — Encounter: Payer: Self-pay | Admitting: Internal Medicine

## 2017-06-27 ENCOUNTER — Ambulatory Visit (INDEPENDENT_AMBULATORY_CARE_PROVIDER_SITE_OTHER): Payer: PRIVATE HEALTH INSURANCE | Admitting: Internal Medicine

## 2017-06-27 VITALS — BP 130/84 | HR 91 | Temp 98.4°F | Ht 64.0 in | Wt 143.2 lb

## 2017-06-27 DIAGNOSIS — F902 Attention-deficit hyperactivity disorder, combined type: Secondary | ICD-10-CM

## 2017-06-27 MED ORDER — AMPHETAMINE-DEXTROAMPHETAMINE 10 MG PO TABS: ORAL_TABLET | ORAL | 0 refills | Status: DC

## 2017-06-27 MED ORDER — HYDROCORTISONE 2.5 % EX CREA: TOPICAL_CREAM | Freq: Two times a day (BID) | CUTANEOUS | 1 refills | Status: AC

## 2017-06-27 NOTE — Progress Notes (Signed)
Subjective:    Patient ID: [name], female    DOB: January 27, 1986, 31 y.o.   MRN: [id number]  DOS:  06/27/2017 Type of visit - description : rov Interval history: ADD: Good compliance with Adderall, needs a refill, medication works well for her. Also, I reviewed the chart, she has seen another provider in [facility], and the time she was somewhat anxious, work related, was prescribed Zoloft. Took it temporarily but now is feeling well and states does not need to take any anxiety medication daily.   Review of Systems Denies depression. No chest pain or palpitations No insomnia  Past Medical History:  Diagnosis Date  . ADD (attention deficit disorder)   . Allergic rhinitis     Past Surgical History:  Procedure Laterality Date  . APPENDECTOMY      Social History   Social History  . Marital status: Single    Spouse name: N/A  . Number of children: 0  . Years of education: N/A   Occupational History  . attendingt law school at [employer]   Social History Main Topics  . Smoking status: Never Smoker  . Smokeless tobacco: Never Used  . Alcohol use Yes     Comment: socially   . Drug use: No  . Sexual activity: Not on file   Other Topics Concern  . Not on file   Social History Narrative   Graduated from college at [employer] ,    [employer] law school    Lives in [place]      Allergies as of 06/27/2017   No Known Allergies     Medication List       Accurate as of 06/27/17  2:48 PM. Always use your most recent med list.          ALPRAZolam 0.5 MG tablet Commonly known as:  XANAX Take 1-2 tablets (0.5-1 mg total) by mouth daily as needed for anxiety (and traveling).   amphetamine-dextroamphetamine 10 MG tablet Commonly known as:  ADDERALL Take 2 tablets by mouth in the morning, and 1 tablet by mouth in evening as needed.   IUD'S IU by Intrauterine route.          Objective:   Physical Exam BP 130/84   Pulse  91   Temp 98.4 F (36.9 C) (Oral)   Ht  (1.626 m)   Wt 143 lb 3.2 oz (65 kg)   SpO2 99%   BMI 24.58 kg/m  General:   Well developed, well nourished . NAD.  HEENT:  Normocephalic . Face symmetric, atraumatic. Neck: No thyromegaly Lungs:  CTA B Normal respiratory effort, no intercostal retractions, no accessory muscle use. Heart: RRR,  no murmur.  No pretibial edema bilaterally  Skin: Not pale. Not jaundice Neurologic:  alert & oriented X3.  Speech normal, gait appropriate for age and unassisted Psych--  Cognition and judgment appear intact.  Cooperative with normal attention span and concentration.  Behavior appropriate. No anxious or depressed appearing.      Assessment & Plan:  Assessment: ADD Allergic Rhinitis  IUD   PLAN ADD: Well-controlled on Adderall, 2 prescription printed. Needs a UDS today. Advised patient to come back at least every 8 months for a routine check up. Will  call for refills as needed. Anxiety: Few months ago was seen by another practitioner with work related anxiety, took sertraline temporarily, she feels well. Has some anxiety when she flies and take as needed Xanax Declined a  flu shot Female care per gynecology, has an IUD RTC 8 months

## 2017-06-27 NOTE — Patient Instructions (Signed)
GO TO THE LAB :  Please provide a urine sample for a UDS    GO TO THE FRONT DESK Schedule your next appointment for a  Check up in 8 months

## 2017-06-27 NOTE — Progress Notes (Signed)
Pre visit review using our clinic review tool, if applicable. No additional management support is needed unless otherwise documented below in the visit note. 

## 2017-07-03 LAB — PAIN MGMT, PROFILE 8 W/CONF, U
6 Acetylmorphine: NEGATIVE ng/mL (ref <10)
ALCOHOL METABOLITES: POSITIVE ng/mL — AB (ref <500)
ALPHAHYDROXYALPRAZOLAM: NEGATIVE ng/mL (ref <25)
AMINOCLONAZEPAM: NEGATIVE ng/mL (ref <25)
AMPHETAMINES: NEGATIVE ng/mL (ref <500)
Alphahydroxymidazolam: NEGATIVE ng/mL (ref <50)
Alphahydroxytriazolam: NEGATIVE ng/mL (ref <50)
BUPRENORPHINE, URINE: NEGATIVE ng/mL (ref <5)
Benzodiazepines: NEGATIVE ng/mL (ref <100)
COCAINE METABOLITE: NEGATIVE ng/mL (ref <150)
Creatinine: 103.7 mg/dL
ETHYL GLUCURONIDE (ETG): 19758 ng/mL — AB (ref <500)
ETHYL SULFATE (ETS): 3515 ng/mL — AB (ref <100)
Hydroxyethylflurazepam: NEGATIVE ng/mL (ref <50)
LORAZEPAM: NEGATIVE ng/mL (ref <50)
MDMA: NEGATIVE ng/mL (ref <500)
Marijuana Metabolite: NEGATIVE ng/mL (ref <20)
NORDIAZEPAM: NEGATIVE ng/mL (ref <50)
OPIATES: NEGATIVE ng/mL (ref <100)
OXIDANT: NEGATIVE ug/mL (ref <200)
Oxazepam: NEGATIVE ng/mL (ref <50)
Oxycodone: NEGATIVE ng/mL (ref <100)
TEMAZEPAM: NEGATIVE ng/mL (ref <50)
pH: 7.12 (ref 4.5–9.0)

## 2017-10-01 ENCOUNTER — Telehealth: Payer: Self-pay | Admitting: Internal Medicine

## 2017-10-01 NOTE — Telephone Encounter (Signed)
Routed back to provider 

## 2017-10-01 NOTE — Telephone Encounter (Signed)
Copied from CRM [id number]. Topic: Quick Communication - See Telephone Encounter >> Oct 01, 2017  3:36 PM Everardo PacificMoton, Justine Cossin, [place] wrote: CRM for notification. See Telephone encounter for: Patients mother called because the patient needs a refill on her Adderall. If someone could give them a call back at [phone]  10/01/17.

## 2017-10-02 MED ORDER — AMPHETAMINE-DEXTROAMPHETAMINE 10 MG PO TABS: ORAL_TABLET | ORAL | 0 refills | Status: DC

## 2017-10-02 NOTE — Telephone Encounter (Signed)
Sent x2  

## 2017-10-02 NOTE — Telephone Encounter (Signed)
Pt's mother, [name] called to request refill on Adderall for Pt.   Last OV: 06/27/2017 Last Fill: 06/27/2017 #90 and 0RF (For October and November 2018) UDS: 06/27/2017 Low risk  NCCR printed; no issues note.   Please advise.

## 2018-02-04 ENCOUNTER — Telehealth: Payer: Self-pay | Admitting: Internal Medicine

## 2018-02-04 NOTE — Telephone Encounter (Signed)
Copied from CRM [id number]. Topic: Quick Communication - Rx Refill/Question >> Feb 04, 2018 12:07 PM Mickel Baas B, NT wrote: Medication: amphetamine-dextroamphetamine (ADDERALL) 10 MG tablet Has the patient contacted their pharmacy? Yes.   (Agent: If no, request that the patient contact the pharmacy for the refill.) Preferred Pharmacy (with phone number or street name): [id number] [place], [place] - [address] Agent: Please be advised that RX refills may take up to 3 business days. We ask that you follow-up with your pharmacy.

## 2018-02-05 MED ORDER — AMPHETAMINE-DEXTROAMPHETAMINE 10 MG PO TABS: ORAL_TABLET | ORAL | 0 refills | Status: DC

## 2018-02-05 NOTE — Telephone Encounter (Signed)
Rx refill request: Adderall 10 mg     Last filled: February 2019  LOV: 06/27/17  PCP: Drue Novel  Pharmacy: verified

## 2018-02-05 NOTE — Telephone Encounter (Signed)
Pt is requesting refill on Adderall.   Last OV: 06/27/2017 Last Fill: 10/02/2017 #90 and 0RF (For January and February 2019) UDS: 07/04/2017 Low risk  NCCR printed- no discrepancies noted- sent for scanning.   Please advise

## 2018-02-05 NOTE — Telephone Encounter (Signed)
Sent!

## 2018-03-18 ENCOUNTER — Telehealth: Payer: Self-pay | Admitting: Internal Medicine

## 2018-03-18 NOTE — Telephone Encounter (Signed)
Left VM re: refill; pt needs appt.  Was to f/u with Dr. Drue NovelPaz in May.

## 2018-03-18 NOTE — Telephone Encounter (Signed)
Copied from CRM [id number]. Topic: Quick Communication - Rx Refill/Question >> Mar 18, 2018  9:22 AM [name], [name], NT wrote: Medication: amphetamine-dextroamphetamine (ADDERALL) 10 MG tablet [[id number]][[id number]]   Has the patient contacted their pharmacy? yes (Agent: If no, request that the patient contact the pharmacy for the refill.) (Agent: If yes, when and what did the pharmacy advise?)  Preferred Pharmacy (with phone number or street name): [id number] [place], [place] - [address] [facility] [address] [place] [id number] Phone: [phone] Fax: [phone]    Agent: Please be advised that RX refills may take up to 3 business days. We ask that you follow-up with your pharmacy.

## 2018-04-03 ENCOUNTER — Ambulatory Visit: Payer: PRIVATE HEALTH INSURANCE | Admitting: Internal Medicine

## 2018-04-06 ENCOUNTER — Ambulatory Visit: Payer: PRIVATE HEALTH INSURANCE | Admitting: Internal Medicine

## 2018-04-06 ENCOUNTER — Encounter: Payer: Self-pay | Admitting: Internal Medicine

## 2018-04-06 VITALS — BP 116/78 | HR 76 | Temp 98.4°F | Resp 16 | Ht 64.0 in | Wt 142.0 lb

## 2018-04-06 DIAGNOSIS — F902 Attention-deficit hyperactivity disorder, combined type: Secondary | ICD-10-CM

## 2018-04-06 MED ORDER — AMPHETAMINE-DEXTROAMPHETAMINE 10 MG PO TABS: ORAL_TABLET | ORAL | 0 refills | Status: DC

## 2018-04-06 NOTE — Progress Notes (Signed)
Pre visit review using our clinic review tool, if applicable. No additional management support is needed unless otherwise documented below in the visit note. 

## 2018-04-06 NOTE — Patient Instructions (Signed)
  GO TO THE FRONT DESK Schedule your next appointment for a  Routine visit in 6-8 months   

## 2018-04-06 NOTE — Progress Notes (Signed)
Subjective:    Patient ID: [name], female    DOB: 1985/12/26, 32 y.o.   MRN: [id number]  DOS:  04/06/2018 Type of visit - description : Follow-up Interval history: Feels well, on Adderall for ADD, symptoms controlled   Review of Systems Denies anxiety, depression. Sleeps well. Some stress at work but that is not a major issue. Very rarely takes Xanax , for flying.  Past Medical History:  Diagnosis Date  . ADD (attention deficit disorder)   . Allergic rhinitis     Past Surgical History:  Procedure Laterality Date  . APPENDECTOMY      Social History   Socioeconomic History  . Marital status: Single    Spouse name: Not on file  . Number of children: 0  . Years of education: Not on file  . Highest education level: Not on file  Occupational History  . Occupation: attendingt Social worker school at [employer]     Employer: [employer]  Social Needs  . Financial resource strain: Not on file  . Food insecurity:    Worry: Not on file    Inability: Not on file  . Transportation needs:    Medical: Not on file    Non-medical: Not on file  Tobacco Use  . Smoking status: Never Smoker  . Smokeless tobacco: Never Used  Substance and Sexual Activity  . Alcohol use: Yes    Comment: socially   . Drug use: No  . Sexual activity: Not on file  Lifestyle  . Physical activity:    Days per week: Not on file    Minutes per session: Not on file  . Stress: Not on file  Relationships  . Social connections:    Talks on phone: Not on file    Gets together: Not on file    Attends religious service: Not on file    Active member of club or organization: Not on file    Attends meetings of clubs or organizations: Not on file    Relationship status: Not on file  . Intimate partner violence:    Fear of current or ex partner: Not on file    Emotionally abused: Not on file    Physically abused: Not on file    Forced sexual activity: Not on file  Other Topics Concern  . Not on file    Social History Narrative   Graduated from college at [employer] ,    [employer] law school    Lives in [place]      Allergies as of 04/06/2018   No Known Allergies     Medication List        Accurate as of 04/06/18 11:59 PM. Always use your most recent med list.          ALPRAZolam 0.5 MG tablet Commonly known as:  XANAX Take 1-2 tablets (0.5-1 mg total) by mouth daily as needed for anxiety (and traveling).   amphetamine-dextroamphetamine 10 MG tablet Commonly known as:  ADDERALL Take 2 tablets by mouth in the morning, and 1 tablet by mouth in evening as needed.   hydrocortisone 2.5 % cream Apply topically 2 (two) times daily.   IUD'S IU by Intrauterine route.          Objective:   Physical Exam BP 116/78 (BP Location: Right Arm, Patient Position: Sitting, Cuff Size: Small)   Pulse 76   Temp 98.4 F (36.9 C) (Oral)   Resp 16   Ht 5\' 4"  (1.626 m)  Wt 142 lb (64.4 kg)   SpO2 98%   BMI 24.37 kg/m  General:   Well developed, NAD, see BMI.  HEENT:  Normocephalic . Face symmetric, atraumatic  Neck: No thyromegaly Lungs:  CTA B Normal respiratory effort, no intercostal retractions, no accessory muscle use. Heart: RRR,  no murmur.  No pretibial edema bilaterally  Skin: Not pale. Not jaundice Neurologic:  alert & oriented X3.  Speech normal, gait appropriate for age and unassisted Psych--  Cognition and judgment appear intact.  Cooperative with normal attention span and concentration.  Behavior appropriate. No anxious or depressed appearing.      Assessment & Plan:   Assessment: ADD Allergic Rhinitis  IUD   PLAN ADD: Symptoms well controlled, up-to-date on UDS.  Rx sent, recommend to call or email for additional rescription refills. Preventive care: Sees gynecology once a year, reportedly has blood work, she is aware I am not doing any preventive care. RTC 6 to 8 months

## 2018-04-15 ENCOUNTER — Other Ambulatory Visit: Payer: Self-pay | Admitting: Internal Medicine

## 2018-04-15 MED ORDER — AMPHETAMINE-DEXTROAMPHETAMINE 10 MG PO TABS: ORAL_TABLET | ORAL | 0 refills | Status: DC

## 2018-04-15 MED ORDER — ALPRAZOLAM 0.5 MG PO TABS: 0.5000 mg | ORAL_TABLET | Freq: Every day | ORAL | 0 refills | Status: DC | PRN

## 2018-04-15 NOTE — Telephone Encounter (Signed)
[facility] doesn't accept electronic controlled substances- spoke w/ [name]- she will have her mom Evelyn MccreedyBarbara pick up both Rx's tomorrow. Rx printed and placed at front desk.

## 2018-04-15 NOTE — Telephone Encounter (Signed)
Noted  

## 2018-04-15 NOTE — Telephone Encounter (Signed)
Adderall on back order [facility]. She is needing [facility] to go [facility]. I have pended medication below.  She is also requesting refill on Xanax 0.5mg -[facility]

## 2018-04-15 NOTE — Telephone Encounter (Signed)
[facility] called and said [facility] and she will call back with the  name of another pharmacy that has the medication today

## 2018-04-15 NOTE — Telephone Encounter (Signed)
Copied from CRM [id number]. Topic: Quick Communication - See Telephone Encounter >> Apr 15, 2018  3:46 PM Terisa Starraylor, Brittany L wrote: CRM for notification. See Telephone encounter for: 04/15/18.  Patient was in the office on 7/8 and asked that her medications be sent in for hydrocortisone 2.5 % cream & ALPRAZolam (XANAX) 0.5 MG tablet.  It shows that the amphetamine-dextroamphetamine (ADDERALL) 10 MG tablet was sent but she has not received a text from them.   [id number] [place], [place] - [address]

## 2018-04-15 NOTE — Telephone Encounter (Signed)
Patient called and states she needs the medication listed below to go to. Please see below message   [facility]  [address]  [place] [phone] tele # [phone]

## 2018-05-21 ENCOUNTER — Other Ambulatory Visit: Payer: Self-pay | Admitting: Internal Medicine

## 2018-05-21 NOTE — Telephone Encounter (Signed)
Refill of adderall  LOV 04/06/18 [facility]  [facility]90  0 refills  Pt requests a written Rx as she stated the pharmacy will not accept an electronic Rx. Pt would like to come in to pick up Rx tomorrow    [facility] [place] - [address]   [phone] (Phone) [phone] (Fax)

## 2018-05-21 NOTE — Telephone Encounter (Signed)
Copied from CRM [id number]. Topic: Quick Communication - Rx Refill/Question >> May 21, 2018  2:05 PM Mcneil, Ja-Kwan wrote: Medication: amphetamine-dextroamphetamine (ADDERALL) 10 MG tablet  Pt requests a written Rx as she stated the pharmacy will not accept an electronic Rx. Pt would like to come in to pick up Rx.  Has the patient contacted their pharmacy? no  Preferred Pharmacy (with phone number or street name): [facility]- [place], [place] - [address] [phone] (Phone) [phone] (Fax)  Agent: Please be advised that RX refills may take up to 3 business days. We ask that you follow-up with your pharmacy.  Need this tomorrow because that's when mom, [name] will be able to pick it up.

## 2018-05-22 ENCOUNTER — Other Ambulatory Visit: Payer: Self-pay | Admitting: Internal Medicine

## 2018-05-22 MED ORDER — AMPHETAMINE-DEXTROAMPHETAMINE 10 MG PO TABS: ORAL_TABLET | ORAL | 0 refills | Status: DC

## 2018-05-22 NOTE — Telephone Encounter (Signed)
Requesting:Adderall Contract:10/20/15 needs updated csc UDS:06/27/17 Last Visit:04/06/18 Next Visit:none Last Refill:04/15/18  Patient was seen at [facility] on 04/20/18?   Please Advise

## 2018-05-22 NOTE — Progress Notes (Signed)
Addendum: Patient actually liked theprescription printed, I did, will void the electronic prescription. JP

## 2018-05-22 NOTE — Telephone Encounter (Signed)
Patient's mother came in this afternoon and needed prescription printed and not sent electronically. Rx has been printed. Electronic rx canceled.

## 2018-05-22 NOTE — Telephone Encounter (Signed)
Prescription erroneously printed, destroyed. Prescription sent electronically.

## 2018-06-29 ENCOUNTER — Telehealth: Payer: Self-pay | Admitting: Internal Medicine

## 2018-06-29 MED ORDER — AMPHETAMINE-DEXTROAMPHETAMINE 10 MG PO TABS: ORAL_TABLET | ORAL | 0 refills | Status: DC

## 2018-06-29 NOTE — Telephone Encounter (Signed)
Copied from CRM [id number]. Topic: Quick Communication - See Telephone Encounter >> Jun 29, 2018  1:38 PM Windy Kalata, NT wrote: CRM for notification. See Telephone encounter for: 06/29/18.  Patient mother is calling and requesting a refill on amphetamine-dextroamphetamine (ADDERALL) 10 MG tablet. The patient mother will be picking up the paper prescription and mailing it to her as it is easier for her. Please contact the mother when the RX is ready. [phone].

## 2018-06-29 NOTE — Telephone Encounter (Signed)
Spoke w/ [name], informed Rx is ready for pick up at front desk.

## 2018-06-29 NOTE — Telephone Encounter (Signed)
Pt's mother [name] requesting refill on Adderall for Pt.   Last OV: 04/06/2018 Last Fill: 05/22/2018 #90 and 0RF UDS: 07/04/2017 Low risk  Pt's mother requesting it to be printed- she will pick up prescription.

## 2018-06-29 NOTE — Telephone Encounter (Signed)
done

## 2018-07-30 ENCOUNTER — Telehealth: Payer: Self-pay | Admitting: Internal Medicine

## 2018-07-30 NOTE — Telephone Encounter (Signed)
Okay to refill but they need a physical prescription.  I won't be able to do that until Monday 11/4.  If is urgent, please discuss with one of the other practitioners in the office

## 2018-07-30 NOTE — Telephone Encounter (Signed)
Copied from CRM [id number]. Topic: Quick Communication - Rx Refill/Question >> Jul 30, 2018  4:08 PM Baldo Daub L wrote: Medication: amphetamine-dextroamphetamine (ADDERALL) 10 MG tablet  Has the patient contacted their pharmacy? No.  Mother, [name], states she needs to pick up physical script (Agent: If no, request that the patient contact the pharmacy for the refill.) (Agent: If yes, when and what did the pharmacy advise?)  Preferred Pharmacy (with phone number or street name): N/A  Agent: Please be advised that RX refills may take up to 3 business days. We ask that you follow-up with your pharmacy.

## 2018-07-31 NOTE — Telephone Encounter (Signed)
Patient mom called to state that Monday is fine for the prescription. The patient's mom will pick up the script on Monday(11/4)after 3:15pm [phone]

## 2018-08-03 MED ORDER — AMPHETAMINE-DEXTROAMPHETAMINE 10 MG PO TABS: ORAL_TABLET | ORAL | 0 refills | Status: DC

## 2018-08-03 NOTE — Telephone Encounter (Signed)
Rx's placed at front desk. Pt's mother [name] will be here later to pick them up.

## 2018-08-03 NOTE — Telephone Encounter (Signed)
Printed x 2 RXs

## 2018-12-04 ENCOUNTER — Telehealth: Payer: Self-pay

## 2018-12-04 MED ORDER — AMPHETAMINE-DEXTROAMPHETAMINE 10 MG PO TABS: ORAL_TABLET | ORAL | 0 refills | Status: DC

## 2018-12-04 NOTE — Telephone Encounter (Signed)
Pt's mom requesting refill on Adderall for Pt. They are requesting 2 months.   Last OV: 04/06/2018 Last Fill: 08/03/2018 #90 and 0RF UDS: 07/04/2017 Low risk

## 2018-12-04 NOTE — Telephone Encounter (Signed)
Copied from CRM [id number]. Topic: General - Other >> Dec 03, 2018  3:20 PM Jaquita Rector A wrote: Reason for CRM: Patient's mom called to request Rx for refill on amphetamine-dextroamphetamine (ADDERALL) 10 MG tablet. She would like to come in and pick up the hard copy Rx please because its easier to get it fill while patient is out of town. Also asking can she please get a 2 month supply. Please call mom when Rx is ready  Mom # [phone]

## 2018-12-04 NOTE — Telephone Encounter (Signed)
Mother called back and will come pick up after school today.

## 2018-12-04 NOTE — Telephone Encounter (Signed)
Tried calling Pt's mom [name] to let her know Rx's are ready for pick up at front desk however no answer, voicemail box is full.

## 2018-12-04 NOTE — Telephone Encounter (Signed)
Prescriptions printed

## 2019-01-22 ENCOUNTER — Telehealth: Payer: Self-pay

## 2019-01-22 NOTE — Telephone Encounter (Signed)
[name]- can you reach out to Pt and offer virtual vist- she is overdue for visit w/ PCP.

## 2019-01-25 NOTE — Telephone Encounter (Signed)
Spoke with pt and schedule fu appt on 02-01-2019 at 2:40. Done

## 2019-02-01 ENCOUNTER — Ambulatory Visit (INDEPENDENT_AMBULATORY_CARE_PROVIDER_SITE_OTHER): Payer: PRIVATE HEALTH INSURANCE | Admitting: Internal Medicine

## 2019-02-01 ENCOUNTER — Other Ambulatory Visit: Payer: Self-pay

## 2019-02-01 DIAGNOSIS — F902 Attention-deficit hyperactivity disorder, combined type: Secondary | ICD-10-CM

## 2019-02-01 MED ORDER — AMPHETAMINE-DEXTROAMPHETAMINE 10 MG PO TABS: ORAL_TABLET | ORAL | 0 refills | Status: DC

## 2019-02-01 NOTE — Progress Notes (Signed)
Subjective:    Patient ID: [name], female    DOB: 07-May-1986, 33 y.o.   MRN: [id number]  DOS:  02/01/2019 Type of visit - description: Virtual Visit via Video Note  I connected with@ on 02/01/19 at  2:40 PM EDT by a video enabled telemedicine application and verified that I am speaking with the correct person using two identifiers.   THIS ENCOUNTER IS A VIRTUAL VISIT DUE TO COVID-19 - PATIENT WAS NOT SEEN IN THE OFFICE. PATIENT HAS CONSENTED TO VIRTUAL VISIT / TELEMEDICINE VISIT   Location of patient: home  Location of provider: office  I discussed the limitations of evaluation and management by telemedicine and the availability of in person appointments. The patient expressed understanding and agreed to proceed.  History of Present Illness: Routine visit ADD: Well-controlled with current doses of Adderall Anxiety: Only uses Xanax when she flies, no recent trips, no  Planned trips in the near future.  Review of Systems Denies anxiety, depression. No difficulty sleeping No recent fever, chills, cough, nausea or vomiting.  Past Medical History:  Diagnosis Date  . ADD (attention deficit disorder)   . Allergic rhinitis     Past Surgical History:  Procedure Laterality Date  . APPENDECTOMY      Social History   Socioeconomic History  . Marital status: Single    Spouse name: Not on file  . Number of children: 0  . Years of education: Not on file  . Highest education level: Not on file  Occupational History  . Occupation: attendingt Social workerlaw school at [employer]     Employer: [employer]  Social Needs  . Financial resource strain: Not on file  . Food insecurity:    Worry: Not on file    Inability: Not on file  . Transportation needs:    Medical: Not on file    Non-medical: Not on file  Tobacco Use  . Smoking status: Never Smoker  . Smokeless tobacco: Never Used  Substance and Sexual Activity  . Alcohol use: Yes    Comment: socially   . Drug use: No  .  Sexual activity: Not on file  Lifestyle  . Physical activity:    Days per week: Not on file    Minutes per session: Not on file  . Stress: Not on file  Relationships  . Social connections:    Talks on phone: Not on file    Gets together: Not on file    Attends religious service: Not on file    Active member of club or organization: Not on file    Attends meetings of clubs or organizations: Not on file    Relationship status: Not on file  . Intimate partner violence:    Fear of current or ex partner: Not on file    Emotionally abused: Not on file    Physically abused: Not on file    Forced sexual activity: Not on file  Other Topics Concern  . Not on file  Social History Narrative   Graduated from college at [employer] ,    Finished law school    [place]ives in [place]      Allergies as of 02/01/2019   No Known Allergies     Medication List       Accurate as of Feb 01, 2019  2:41 PM. Always use your most recent med list.        ALPRAZolam 0.5 MG tablet Commonly known as:  Xanax Take 1-2 tablets (  0.5-1 mg total) by mouth daily as needed for anxiety (and traveling).   amphetamine-dextroamphetamine 10 MG tablet Commonly known as:  Adderall Take 2 tablets by mouth in the morning, and 1 tablet by mouth in evening as needed.   amphetamine-dextroamphetamine 10 MG tablet Commonly known as:  ADDERALL Take 2 tablets by mouth every morning and 1 tablet by mouth every evening as needed.   IUD'S IU by Intrauterine route.           Objective:   Physical Exam There were no vitals taken for this visit. This is a video conference, patient is alert oriented x3, no apparent distress    Assessment     Assessment: ADD Allergic Rhinitis  IUD   PLAN ADD: Well-controlled, refill medications today as needed. She lives in [place] but her parents live in [place], I asked her to come to the office at the next opportunity to check a UDS. COVID-19: Encouraged to continue  practicing all distance prevention which she is. Preventive care: According to the patient all is done at her gynecologist RTC 6 months     I discussed the assessment and treatment plan with the patient. The patient was provided an opportunity to ask questions and all were answered. The patient agreed with the plan and demonstrated an understanding of the instructions.   The patient was advised to call back or seek an in-person evaluation if the symptoms worsen or if the condition fails to improve as anticipated.

## 2019-02-02 DIAGNOSIS — Z09 Encounter for follow-up examination after completed treatment for conditions other than malignant neoplasm: Secondary | ICD-10-CM | POA: Insufficient documentation

## 2019-02-02 NOTE — Assessment & Plan Note (Signed)
ADD: Well-controlled, refill medications today as needed. She lives in [place] but her parents live in [place], I asked her to come to the office at the next opportunity to check a UDS. COVID-19: Encouraged to continue practicing all distance prevention which she is. Preventive care: According to the patient all is done at her gynecologist RTC 6 months

## 2019-06-25 ENCOUNTER — Telehealth: Payer: Self-pay | Admitting: Internal Medicine

## 2019-06-25 MED ORDER — AMPHETAMINE-DEXTROAMPHETAMINE 10 MG PO TABS: ORAL_TABLET | ORAL | 0 refills | Status: DC

## 2019-06-25 NOTE — Telephone Encounter (Signed)
Patient's mother [name] is calling to request a paper script on Tuesday for amphetamine-dextroamphetamine (ADDERALL) 10 MG tablet [[id number]]    Patient does not want the script calling in anywhere.  Please advise CB- [name]-[phone]  Patient-[phone]

## 2019-06-25 NOTE — Telephone Encounter (Signed)
Spoke w/ Pt's mom- Pamala Hurry- informed that Rx is ready for pick up at front desk. Informed that Pt needs in person visit by November 2020 and UDS. Pamala Hurry will inform Pt.

## 2019-06-25 NOTE — Telephone Encounter (Signed)
Adderall refill.   Last OV: 02/01/2019 Last Fill: 02/01/2019 #90 and 0RF UDS: 06/27/2017 Low risk

## 2019-06-25 NOTE — Telephone Encounter (Signed)
PDMP reviewed and ok Needs a UDS Needs to be seen in person by November 2020, please arrange

## 2019-08-03 ENCOUNTER — Ambulatory Visit: Payer: PRIVATE HEALTH INSURANCE | Admitting: Internal Medicine

## 2019-08-05 ENCOUNTER — Other Ambulatory Visit: Payer: Self-pay

## 2019-08-05 ENCOUNTER — Ambulatory Visit (INDEPENDENT_AMBULATORY_CARE_PROVIDER_SITE_OTHER): Payer: PRIVATE HEALTH INSURANCE | Admitting: Internal Medicine

## 2019-08-05 DIAGNOSIS — Z79899 Other long term (current) drug therapy: Secondary | ICD-10-CM

## 2019-08-05 DIAGNOSIS — F902 Attention-deficit hyperactivity disorder, combined type: Secondary | ICD-10-CM | POA: Diagnosis not present

## 2019-08-05 MED ORDER — AMPHETAMINE-DEXTROAMPHETAMINE 10 MG PO TABS: ORAL_TABLET | ORAL | 0 refills | Status: DC

## 2019-08-05 NOTE — Progress Notes (Signed)
Subjective:    Patient ID: [name], female    DOB: [birth year], 33 y.o.   MRN: [id number]  DOS:  08/05/2019 Type of visit - description: Virtual Visit via Video Note  I connected with@   by a video enabled telemedicine application and verified that I am speaking with the correct person using two identifiers.   THIS ENCOUNTER IS A VIRTUAL VISIT DUE TO COVID-19 - PATIENT WAS NOT SEEN IN THE OFFICE. PATIENT HAS CONSENTED TO VIRTUAL VISIT / TELEMEDICINE VISIT   Location of patient: home  Location of provider: office  I discussed the limitations of evaluation and management by telemedicine and the availability of in person appointments. The patient expressed understanding and agreed to proceed.  History of Present Illness: Follow-up ADD: Takes Adderall as needed, typically not during the weekends. Symptoms are well controlled without apparent side effects. 2 weeks ago went to her dentist, BP reportedly was normal.   Review of Systems Denies depression, had some anxiety mostly due to the political environment but that is not a recurrent problem. No insomnia. Medication list reviewed She follows good COVID-19 precautions  Past Medical History:  Diagnosis Date  . ADD (attention deficit disorder)   . Allergic rhinitis     Past Surgical History:  Procedure Laterality Date  . APPENDECTOMY      Social History   Socioeconomic History  . Marital status: Single    Spouse name: Not on file  . Number of children: 0  . Years of education: Not on file  . Highest education level: Not on file  Occupational History  . Occupation: attendingt Social worker school at [employer]     Employer: [employer]  Social Needs  . Financial resource strain: Not on file  . Food insecurity    Worry: Not on file    Inability: Not on file  . Transportation needs    Medical: Not on file    Non-medical: Not on file  Tobacco Use  . Smoking status: Never Smoker  . Smokeless tobacco: Never Used   Substance and Sexual Activity  . Alcohol use: Yes    Comment: socially   . Drug use: No  . Sexual activity: Not on file  Lifestyle  . Physical activity    Days per week: Not on file    Minutes per session: Not on file  . Stress: Not on file  Relationships  . Social Musician on phone: Not on file    Gets together: Not on file    Attends religious service: Not on file    Active member of club or organization: Not on file    Attends meetings of clubs or organizations: Not on file    Relationship status: Not on file  . Intimate partner violence    Fear of current or ex partner: Not on file    Emotionally abused: Not on file    Physically abused: Not on file    Forced sexual activity: Not on file  Other Topics Concern  . Not on file  Social History Narrative   Graduated from college at [employer] ,    Finished law school    Lives in [place]      Allergies as of 08/05/2019   No Known Allergies     Medication List       Accurate as of August 05, 2019 11:59 PM. If you have any questions, ask your nurse or doctor.  ALPRAZolam 0.5 MG tablet Commonly known as: Xanax Take 1-2 tablets (0.5-1 mg total) by mouth daily as needed for anxiety (and traveling).   amphetamine-dextroamphetamine 10 MG tablet Commonly known as: Adderall Take 2 tablets by mouth in the morning, and 1 tablet by mouth in evening as needed.   IUD'S IU by Intrauterine route.           Objective:   Physical Exam There were no vitals taken for this visit. This is a virtual video visit, alert oriented x3, no apparent distress.    Assessment     Assessment: ADD Allergic Rhinitis  IUD   PLAN  ADD: Well-controlled with Adderall as needed. PDMP reviewed, no issues. Prescription printed, her mother will pick it up, the patient herself lives in [place]. We agreed that when she comes to [facility] to visit her mother she will call ahead of time and get a  UDS in the next few weeks. Next visit 6 months    I discussed the assessment and treatment plan with the patient. The patient was provided an opportunity to ask questions and all were answered. The patient agreed with the plan and demonstrated an understanding of the instructions.   The patient was advised to call back or seek an in-person evaluation if the symptoms worsen or if the condition fails to improve as anticipated.

## 2019-08-06 NOTE — Assessment & Plan Note (Signed)
ADD: Well-controlled with Adderall as needed. PDMP reviewed, no issues. Prescription printed, her mother will pick it up, the patient herself lives in [place]. We agreed that when she comes to [facility] to visit her mother she will call ahead of time and get a UDS in the next few weeks. Next visit 6 months

## 2019-12-01 ENCOUNTER — Telehealth: Payer: Self-pay | Admitting: Internal Medicine

## 2019-12-01 MED ORDER — ALPRAZOLAM 0.5 MG PO TABS: 0.5000 mg | ORAL_TABLET | Freq: Every day | ORAL | 0 refills | Status: DC | PRN

## 2019-12-01 NOTE — Telephone Encounter (Signed)
Spoke w/ Pt's mother [name] informed Rx at front desk ready for pick up.

## 2019-12-01 NOTE — Telephone Encounter (Signed)
Alprazolam refill. Requesting it to be printed- Pt's mother will pick it up.   Last OV: 08/05/2019  Last Fill: 04/15/2018 #30 and 0RF UDS: 06/27/2017 Low risk

## 2019-12-01 NOTE — Telephone Encounter (Signed)
Printed

## 2019-12-01 NOTE — Telephone Encounter (Signed)
Caller : patient's mother Mosetta Putt  Call Back # [phone]  Requesting a written prescription for ALPRAZolam Prudy Feeler) 0.5 MG tablet  Per patient's mother [facility] [place] [facility] or they don't have it when needed.

## 2019-12-15 ENCOUNTER — Telehealth: Payer: Self-pay

## 2019-12-15 MED ORDER — AMPHETAMINE-DEXTROAMPHETAMINE 10 MG PO TABS: ORAL_TABLET | ORAL | 0 refills | Status: DC

## 2019-12-15 NOTE — Telephone Encounter (Signed)
Patient's mother called in to see if Dr Drue Novel could send in a prescription for amphetamine-dextroamphetamine (ADDERALL) 10 MG tablet [[id number]]    Patient's  mother will pick up the hand written prescription  At the front office. Please give mom a call when its ready for pick up at [phone] thanks,

## 2019-12-15 NOTE — Telephone Encounter (Signed)
LMOM for [name]- informing she needs UDS before next refill.   Spoke w/ [name]- Rx placed at front desk for pick up.

## 2019-12-15 NOTE — Telephone Encounter (Signed)
Agree, a handwritten note was placed on the prescription, needs UDS before the next refill.

## 2019-12-15 NOTE — Telephone Encounter (Signed)
Adderall refill. Requesting to be printed and picked up by Pt's mother [name]- I will inform [name] that Pt will need to come by for UDS before next refill.   Last OV: 08/05/2019 Last Fill: Prescription for 08/05/2019 never picked up- was destroyed UDS: 06/27/2017 Low risk

## 2020-02-04 ENCOUNTER — Other Ambulatory Visit: Payer: Self-pay

## 2020-02-04 ENCOUNTER — Other Ambulatory Visit (INDEPENDENT_AMBULATORY_CARE_PROVIDER_SITE_OTHER): Payer: PRIVATE HEALTH INSURANCE

## 2020-02-04 DIAGNOSIS — F902 Attention-deficit hyperactivity disorder, combined type: Secondary | ICD-10-CM

## 2020-02-04 DIAGNOSIS — Z79899 Other long term (current) drug therapy: Secondary | ICD-10-CM

## 2020-02-04 LAB — TIQ- AMBIGUOUS ORDER

## 2020-02-07 NOTE — Progress Notes (Signed)
02/07/20 Received fax from [facility] that current test code 99357 for UDS is no longer valid. Called and spoke with [place] and provided new test code, 01779. Test has been added.

## 2020-02-12 LAB — TEST AUTHORIZATION

## 2020-02-12 LAB — DRUG MONITORING, PANEL 8 WITH CONFIRMATION, URINE
6 Acetylmorphine: NEGATIVE ng/mL (ref <10)
Alcohol Metabolites: POSITIVE ng/mL — AB
Amphetamine: 1251 ng/mL — ABNORMAL HIGH (ref <250)
Amphetamines: POSITIVE ng/mL — AB (ref <500)
Benzodiazepines: NEGATIVE ng/mL (ref <100)
Buprenorphine, Urine: NEGATIVE ng/mL (ref <5)
Cocaine Metabolite: NEGATIVE ng/mL (ref <150)
Creatinine: 120.3 mg/dL
Ethyl Glucuronide (ETG): 38409 ng/mL — ABNORMAL HIGH (ref <500)
Ethyl Sulfate (ETS): 9935 ng/mL — ABNORMAL HIGH (ref <100)
MDMA: NEGATIVE ng/mL (ref <500)
Marijuana Metabolite: 65 ng/mL — ABNORMAL HIGH (ref <5)
Marijuana Metabolite: POSITIVE ng/mL — AB (ref <20)
Methamphetamine: NEGATIVE ng/mL (ref <250)
Opiates: NEGATIVE ng/mL (ref <100)
Oxidant: NEGATIVE ug/mL
Oxycodone: NEGATIVE ng/mL (ref <100)
pH: 6.3 (ref 4.5–9.0)

## 2020-02-12 LAB — DM TEMPLATE

## 2020-03-27 ENCOUNTER — Telehealth: Payer: Self-pay

## 2020-03-27 DIAGNOSIS — F902 Attention-deficit hyperactivity disorder, combined type: Secondary | ICD-10-CM

## 2020-03-27 DIAGNOSIS — Z79899 Other long term (current) drug therapy: Secondary | ICD-10-CM

## 2020-03-27 NOTE — Telephone Encounter (Signed)
Per Dr. Drue Novel- needs in person visit prior to refill.

## 2020-03-27 NOTE — Telephone Encounter (Signed)
Left voice mail to schedule an appoinment

## 2020-03-27 NOTE — Telephone Encounter (Signed)
Needs in person office visit.

## 2020-03-27 NOTE — Telephone Encounter (Signed)
Adderall refill. Pt requests a printed prescription- she will pick it up.   Last OV: 08/05/2019 Last Fill: 12/15/2019 #90 and 0RF Pt sig: 2 tabs in AM and 1 in PM prn UDS: 03/27/2020 High risk, +marijuana

## 2020-03-27 NOTE — Telephone Encounter (Signed)
Patient called in to get a hand written prescription for amphetamine-dextroamphetamine (ADDERALL) 10 MG tablet [[id number]]    Patient would like to pick it up in office when ever it is ready. Please call [phone]

## 2020-04-20 ENCOUNTER — Encounter: Payer: Self-pay | Admitting: Internal Medicine

## 2020-05-05 ENCOUNTER — Ambulatory Visit: Payer: PRIVATE HEALTH INSURANCE | Admitting: Internal Medicine

## 2020-05-05 ENCOUNTER — Other Ambulatory Visit: Payer: Self-pay

## 2020-05-05 ENCOUNTER — Encounter: Payer: Self-pay | Admitting: Internal Medicine

## 2020-05-05 VITALS — BP 126/81 | HR 81 | Temp 98.1°F | Resp 16 | Ht 64.0 in | Wt 146.5 lb

## 2020-05-05 DIAGNOSIS — Z79899 Other long term (current) drug therapy: Secondary | ICD-10-CM | POA: Diagnosis not present

## 2020-05-05 DIAGNOSIS — F902 Attention-deficit hyperactivity disorder, combined type: Secondary | ICD-10-CM

## 2020-05-05 MED ORDER — AMPHETAMINE-DEXTROAMPHETAMINE 10 MG PO TABS: ORAL_TABLET | ORAL | 0 refills | Status: DC

## 2020-05-05 NOTE — Progress Notes (Signed)
Pre visit review using our clinic review tool, if applicable. No additional management support is needed unless otherwise documented below in the visit note. 

## 2020-05-05 NOTE — Patient Instructions (Addendum)
Happy early [place] Ouch!       GO TO THE LAB : Provide a urine sample   GO TO THE FRONT DESK, PLEASE SCHEDULE YOUR APPOINTMENTS Come back for a checkup or physical exam in 8 months

## 2020-05-05 NOTE — Progress Notes (Signed)
   Subjective:    Patient ID: [name], female    DOB: 03-06-1986, 34 y.o.   MRN: [id number]  DOS:  05/05/2020 Type of visit - description: Routine visit Since the last office visit she is doing well. Admits to some stress from work but no anxiety, depression or insomnia per se.   Review of Systems See above   Past Medical History:  Diagnosis Date  . ADD (attention deficit disorder)   . Allergic rhinitis     Past Surgical History:  Procedure Laterality Date  . APPENDECTOMY      Allergies as of 05/05/2020   No Known Allergies     Medication List       Accurate as of May 05, 2020  2:05 PM. If you have any questions, ask your nurse or doctor.        ALPRAZolam 0.5 MG tablet Commonly known as: Xanax Take 1-2 tablets (0.5-1 mg total) by mouth daily as needed for anxiety (and traveling).   amphetamine-dextroamphetamine 10 MG tablet Commonly known as: Adderall Take 2 tablets by mouth in the morning, and 1 tablet by mouth in evening as needed.   IUD'S IU by Intrauterine route.          Objective:   Physical Exam BP 126/81 (BP Location: Left Arm, Patient Position: Sitting, Cuff Size: Small)   Pulse 81   Temp 98.1 F (36.7 C) (Oral)   Resp 16   Ht 5\' 4"  (1.626 m)   Wt 146 lb 8 oz (66.5 kg)   SpO2 100%   BMI 25.15 kg/m  General:   Well developed, NAD, BMI noted. HEENT:  Normocephalic . Face symmetric, atraumatic  Skin: Not pale. Not jaundice Neurologic:  alert & oriented X3.  Speech normal, gait appropriate for age and unassisted Psych--  Cognition and judgment appear intact.  Cooperative with normal attention span and concentration.  Behavior appropriate. No anxious or depressed appearing.      Assessment      Assessment: ADD Allergic Rhinitis  IUD   PLAN  ADD: Well controlled with current dose of Adderall. Last UDS showed + marijuana, she does that rarely, last UDS was collected  immediately after a vacation.  Advised against routine  use of marijuana. Adderall RF sent.  Check a UDS. Preventive care: Had Covid vaccinations, encouraged to see gynecology regularly.  If no labs are done at her gynecology encouraged to schedule a CPX in 8 months. RTC 8 months This visit occurred during the SARS-CoV-2 public health emergency.  Safety protocols were in place, including screening questions prior to the visit, additional usage of staff PPE, and extensive cleaning of exam room while observing appropriate contact time as indicated for disinfecting solutions.

## 2020-05-06 NOTE — Assessment & Plan Note (Signed)
ADD: Well controlled with current dose of Adderall. Last UDS showed + marijuana, she does that rarely, last UDS was collected  immediately after a vacation.  Advised against routine use of marijuana. Adderall RF sent.  Check a UDS. Preventive care: Had Covid vaccinations, encouraged to see gynecology regularly.  If no labs are done at her gynecology encouraged to schedule a CPX in 8 months. RTC 8 months

## 2020-05-07 LAB — DRUG MONITORING, PANEL 8 WITH CONFIRMATION, URINE
6 Acetylmorphine: NEGATIVE ng/mL (ref <10)
Alcohol Metabolites: POSITIVE ng/mL — AB
Amphetamine: 2671 ng/mL — ABNORMAL HIGH (ref <250)
Amphetamines: POSITIVE ng/mL — AB (ref <500)
Benzodiazepines: NEGATIVE ng/mL (ref <100)
Buprenorphine, Urine: NEGATIVE ng/mL (ref <5)
Cocaine Metabolite: NEGATIVE ng/mL (ref <150)
Creatinine: 48.1 mg/dL
Ethyl Glucuronide (ETG): 5879 ng/mL — ABNORMAL HIGH (ref <500)
Ethyl Sulfate (ETS): 1056 ng/mL — ABNORMAL HIGH (ref <100)
MDMA: NEGATIVE ng/mL (ref <500)
Marijuana Metabolite: NEGATIVE ng/mL (ref <20)
Marijuana Metabolite: NEGATIVE ng/mL (ref <5)
Methamphetamine: NEGATIVE ng/mL (ref <250)
Opiates: NEGATIVE ng/mL (ref <100)
Oxidant: NEGATIVE ug/mL
Oxycodone: NEGATIVE ng/mL (ref <100)
pH: 5.5 (ref 4.5–9.0)

## 2020-05-07 LAB — DM TEMPLATE

## 2020-07-20 ENCOUNTER — Other Ambulatory Visit: Payer: Self-pay | Admitting: Internal Medicine

## 2020-07-20 NOTE — Telephone Encounter (Signed)
Caller name: [name] (mom) Call back number: [phone]  Print RX- mom will pick up  Medication:   ALPRAZolam (XANAX) 0.5 MG tablet [[id number]]     amphetamine-dextroamphetamine (ADDERALL) 10 MG tablet [[id number]]   Has the patient contacted their pharmacy?  (If no, request that the patient contact the pharmacy for the refill.) (If yes, when and what did the pharmacy advise?)     Preferred Pharmacy (with phone number or street name): Print RX- mom will pick up     Agent: Please be advised that RX refills may take up to 3 business days. We ask that you follow-up with your pharmacy.

## 2020-07-20 NOTE — Telephone Encounter (Signed)
Requesting: Adderall 10mg  and alprazolam 0.5mg  Contract: 10/20/2015 UDS: 05/05/2020 Low risk Last Visit: 05/05/2020 Next Visit: None scheduled  Last Refill on Adderall: 05/05/2020 #90 and 0RF Last Refill on alprazolam: 12/01/2019 #30 and 0RF  Please print- Pt's mom will pick up.   Please Advise

## 2020-07-21 MED ORDER — ALPRAZOLAM 0.5 MG PO TABS: 0.5000 mg | ORAL_TABLET | Freq: Every day | ORAL | 0 refills | Status: AC | PRN

## 2020-07-21 MED ORDER — AMPHETAMINE-DEXTROAMPHETAMINE 10 MG PO TABS: ORAL_TABLET | ORAL | 0 refills | Status: DC

## 2020-07-21 NOTE — Telephone Encounter (Signed)
Prescriptions printed (2 prescriptions for Adderall)

## 2020-07-21 NOTE — Telephone Encounter (Signed)
Tried calling Pts mom Virgie Dad answered and stated I couldn't speak with her. I asked that he have her return my call. He then hung up.

## 2020-10-06 ENCOUNTER — Telehealth: Payer: Self-pay | Admitting: Internal Medicine

## 2020-10-06 NOTE — Telephone Encounter (Signed)
Requesting: Adderall 10mg  Contract: 10/20/2015 UDS: 05/05/2020 Last Visit: 05/05/2020 Next Visit: None Last Refill: 07/21/2020 #90 and 0RF Pt sig: 2 tabs in AM, 1 in PM prn  Pt requesting to be printed and requesting 2 months.  Please Advise

## 2020-10-06 NOTE — Telephone Encounter (Signed)
Caller Laurine, [place]   Call back number  [phone]  Patient mother would like a written prescription for amphetamine-dextroamphetamine (ADDERALL) 10 MG tablet [[id number]]    and she will pick it up in the office once its ready she need a 2 month supply

## 2020-10-09 MED ORDER — AMPHETAMINE-DEXTROAMPHETAMINE 10 MG PO TABS: ORAL_TABLET | ORAL | 0 refills | Status: DC

## 2020-10-09 NOTE — Telephone Encounter (Signed)
Spoke w/ Pt's motherBritta Mercer, informed that Rx's are ready for pick up.

## 2020-10-09 NOTE — Telephone Encounter (Signed)
PDMP okay, 2 prescriptions printed.

## 2020-10-20 ENCOUNTER — Telehealth: Payer: Self-pay

## 2020-10-20 NOTE — Telephone Encounter (Signed)
PA initiated via [email or web address], PA EOD ID: [id number]. They are requiring office visit notes to be faxed to them at [phone]. Awaiting determination.

## 2020-10-24 NOTE — Telephone Encounter (Signed)
PA approved. Effective 12/19/2020 to 09/29/2098.

## 2020-12-07 ENCOUNTER — Encounter: Payer: Self-pay | Admitting: Internal Medicine

## 2021-02-07 ENCOUNTER — Telehealth: Payer: Self-pay

## 2021-02-07 MED ORDER — AMPHETAMINE-DEXTROAMPHETAMINE 10 MG PO TABS: ORAL_TABLET | ORAL | 0 refills | Status: DC

## 2021-02-07 NOTE — Addendum Note (Signed)
Addended by: Abbe Amsterdam C on: 02/07/2021 03:22 PM   Modules accepted: Orders

## 2021-02-07 NOTE — Telephone Encounter (Signed)
LMOM informing [name] that printed Rx is ready for pick up at front desk.

## 2021-02-07 NOTE — Telephone Encounter (Signed)
[name] called back informed that she was confused about what has been going on today- informed PCP out of the office until May 24- Dr. Patsy Lager has been having issues printing controlled substances (IT is working on this). I informed [name] that Dr. Patsy Lager left a message for Pt to inform her of this earlier today and for her to call if issues with [place] sending it electronically. [name] states that Pt has been texting her to call and figure out why medication was sent electronically all day states that Pt is busy packing to go to [place] (they leave very early tomorrow morning) and is unable to call. 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 I informed we were able to print the prescription- its ready for pick up at the front desk.  Will send this to PCP as well to make him aware.

## 2021-02-07 NOTE — Telephone Encounter (Addendum)
I have tried to call pt- she is 35 years old- as I would typically talk to her directly instead of her mother.  She is not answering and does not have mychart.  My rx printing is not functioning so I was trying to help in sending rx electronically as we generally would- certainly I did not intend to make anyone upset. I was finally able to log in through a different computer and print rx and gave to [facility] to call pt or her mom

## 2021-02-07 NOTE — Telephone Encounter (Signed)
We corrected pharmacy info and I was able to rx electronically Called pt and [facility] with this info

## 2021-02-07 NOTE — Telephone Encounter (Signed)
Just FYI Spoke with [name] around 230pm.  She asked why we sent in her daughters medication electronically cause the pharmacy [facility] pharmacy) does not except escripts.  I advised her that Dr. Drue Novel was out of the office til 02/20/21.  I advised her that I called the pharmacy per Dr. Patsy Lager to see if they accept escripts and they did.  She said that we need to call the pharmacy to see if they did get it, I also asked when the last time it was filled and pharmacy said 01/03/21.  I called [name] back and advised her that they did have it and then she wanted me to give her the name of pharmacy and address (?) and I advised that its the same pharmacy that your daughter used last month.  She got a little more upset and said "just give the address of the pharmacy."

## 2021-02-07 NOTE — Addendum Note (Signed)
Addended by: Celia Friedland C on: 02/07/2021 03:22 PM   Modules accepted: Orders  

## 2021-02-07 NOTE — Telephone Encounter (Signed)
Patient's mother called back very upset. She never wanted med sent electroniclly she wanted it printed so she could pick up. She wants script in [place] cancelled and script printed so she can pick up here in the office. She  Is requesting Lessie Dings call her back to let her know when it's ready.

## 2021-02-07 NOTE — Addendum Note (Signed)
Addended byConrad Splendora D on: 02/07/2021 09:42 AM   Modules accepted: Orders

## 2021-02-07 NOTE — Addendum Note (Signed)
Addended by: Abbe Amsterdam C on: 02/07/2021 10:28 AM   Modules accepted: Orders

## 2021-02-07 NOTE — Telephone Encounter (Signed)
[name] Pt. Pt lives in [place]- Pt's mother [name] always picks up paper script at office. Pt's pharmacy in [place] doesn't accept controlled electronic prescriptions.   Requesting: Adderall 10mg  Contract: 10/20/2015 UDS: 05/05/2020 Last Visit: 05/05/2020 Next Visit: None Last Refill: 10/09/2020 #90 and 0RF Pt sig: 2 tab qam and 1 qpm prn  Please Advise

## 2021-02-20 NOTE — Telephone Encounter (Signed)
I appreciate Dr. Patsy Lager managing this refill, she did absolutely the right thing.  Thank you

## 2021-03-08 ENCOUNTER — Telehealth: Payer: Self-pay | Admitting: Internal Medicine

## 2021-03-08 MED ORDER — AMPHETAMINE-DEXTROAMPHETAMINE 10 MG PO TABS: ORAL_TABLET | ORAL | 0 refills | Status: AC

## 2021-03-08 MED ORDER — AMPHETAMINE-DEXTROAMPHETAMINE 10 MG PO TABS: ORAL_TABLET | ORAL | 0 refills | Status: DC

## 2021-03-08 NOTE — Telephone Encounter (Signed)
PDMP reviewed, alprazolam prescribed elsewhere. Rx printed x2

## 2021-03-08 NOTE — Telephone Encounter (Signed)
Requesting: Adderall 10mg  Contract: 10/20/2015 UDS: 05/05/2020 Last Visit: 05/05/2020 Next Visit: None Last Refill: 02/07/2021 #90 and 0RF  Pt's mother is requesting it to be printed   Please Advise

## 2021-03-08 NOTE — Telephone Encounter (Signed)
Mom would like to pick up prescription. Please call when ready  amphetamine-dextroamphetamine (ADDERALL) 10 MG tablet [[id number]]

## 2021-03-08 NOTE — Telephone Encounter (Signed)
Spoke w/ [name]- informed Rx for June and July ready for pick up. Asked [name] to let Pt know that she is due for CPX before next refill. [name] verbalized understanding.

## 2021-11-28 ENCOUNTER — Encounter: Payer: Self-pay | Admitting: Internal Medicine

## 2023-01-13 ENCOUNTER — Encounter: Payer: Self-pay | Admitting: Internal Medicine
